# Patient Record
Sex: Female | Born: 1955 | Race: Black or African American | Hispanic: No | Marital: Married | Smoking: Never smoker
Health system: Southern US, Community
[De-identification: ages and names within clinical notes are randomized; demographics above are authoritative.]

## PROBLEM LIST (undated history)

## (undated) DIAGNOSIS — I10 Essential (primary) hypertension: Secondary | ICD-10-CM

## (undated) DIAGNOSIS — E7800 Pure hypercholesterolemia, unspecified: Secondary | ICD-10-CM

## (undated) HISTORY — PX: NECK SURGERY: SHX720

## (undated) HISTORY — PX: COLONOSCOPY: SHX174

---

## 2001-05-06 ENCOUNTER — Encounter: Payer: Self-pay | Admitting: General Surgery

## 2001-05-06 ENCOUNTER — Ambulatory Visit (HOSPITAL_COMMUNITY): Admission: RE | Admit: 2001-05-06 | Discharge: 2001-05-06 | Payer: Self-pay | Admitting: General Surgery

## 2002-03-05 ENCOUNTER — Encounter: Payer: Self-pay | Admitting: General Surgery

## 2002-03-05 ENCOUNTER — Ambulatory Visit (HOSPITAL_COMMUNITY): Admission: RE | Admit: 2002-03-05 | Discharge: 2002-03-05 | Payer: Self-pay | Admitting: General Surgery

## 2003-03-08 ENCOUNTER — Encounter: Payer: Self-pay | Admitting: General Surgery

## 2003-03-08 ENCOUNTER — Ambulatory Visit (HOSPITAL_COMMUNITY): Admission: RE | Admit: 2003-03-08 | Discharge: 2003-03-08 | Payer: Self-pay | Admitting: General Surgery

## 2003-07-23 ENCOUNTER — Encounter: Payer: Self-pay | Admitting: General Surgery

## 2003-07-23 ENCOUNTER — Ambulatory Visit (HOSPITAL_COMMUNITY): Admission: RE | Admit: 2003-07-23 | Discharge: 2003-07-23 | Payer: Self-pay | Admitting: General Surgery

## 2003-09-09 ENCOUNTER — Ambulatory Visit (HOSPITAL_COMMUNITY): Admission: RE | Admit: 2003-09-09 | Discharge: 2003-09-09 | Payer: Self-pay | Admitting: General Surgery

## 2003-09-15 ENCOUNTER — Ambulatory Visit (HOSPITAL_COMMUNITY): Admission: RE | Admit: 2003-09-15 | Discharge: 2003-09-15 | Payer: Self-pay | Admitting: General Surgery

## 2003-09-15 ENCOUNTER — Encounter: Payer: Self-pay | Admitting: General Surgery

## 2004-03-13 ENCOUNTER — Ambulatory Visit (HOSPITAL_COMMUNITY): Admission: RE | Admit: 2004-03-13 | Discharge: 2004-03-13 | Payer: Self-pay | Admitting: Occupational Therapy

## 2005-03-22 ENCOUNTER — Ambulatory Visit (HOSPITAL_COMMUNITY): Admission: RE | Admit: 2005-03-22 | Discharge: 2005-03-22 | Payer: Self-pay | Admitting: General Surgery

## 2006-03-26 ENCOUNTER — Ambulatory Visit (HOSPITAL_COMMUNITY): Admission: RE | Admit: 2006-03-26 | Discharge: 2006-03-26 | Payer: Self-pay | Admitting: General Surgery

## 2006-05-23 ENCOUNTER — Emergency Department (HOSPITAL_COMMUNITY): Admission: EM | Admit: 2006-05-23 | Discharge: 2006-05-23 | Payer: Self-pay | Admitting: Emergency Medicine

## 2006-06-01 ENCOUNTER — Emergency Department (HOSPITAL_COMMUNITY): Admission: EM | Admit: 2006-06-01 | Discharge: 2006-06-01 | Payer: Self-pay | Admitting: Emergency Medicine

## 2006-10-30 ENCOUNTER — Ambulatory Visit: Payer: Self-pay | Admitting: Gastroenterology

## 2006-10-30 ENCOUNTER — Ambulatory Visit (HOSPITAL_COMMUNITY): Admission: RE | Admit: 2006-10-30 | Discharge: 2006-10-30 | Payer: Self-pay | Admitting: Gastroenterology

## 2007-03-28 ENCOUNTER — Ambulatory Visit (HOSPITAL_COMMUNITY): Admission: RE | Admit: 2007-03-28 | Discharge: 2007-03-28 | Payer: Self-pay | Admitting: Family Medicine

## 2008-03-30 ENCOUNTER — Ambulatory Visit (HOSPITAL_COMMUNITY): Admission: RE | Admit: 2008-03-30 | Discharge: 2008-03-30 | Payer: Self-pay | Admitting: Family Medicine

## 2008-04-22 ENCOUNTER — Ambulatory Visit (HOSPITAL_COMMUNITY): Admission: RE | Admit: 2008-04-22 | Discharge: 2008-04-22 | Payer: Self-pay | Admitting: Family Medicine

## 2009-03-07 ENCOUNTER — Encounter (HOSPITAL_COMMUNITY): Admission: RE | Admit: 2009-03-07 | Discharge: 2009-04-06 | Payer: Self-pay | Admitting: Family Medicine

## 2009-04-08 ENCOUNTER — Ambulatory Visit (HOSPITAL_COMMUNITY): Admission: RE | Admit: 2009-04-08 | Discharge: 2009-04-08 | Payer: Self-pay | Admitting: Family Medicine

## 2009-04-22 ENCOUNTER — Ambulatory Visit (HOSPITAL_COMMUNITY): Admission: RE | Admit: 2009-04-22 | Discharge: 2009-04-23 | Payer: Self-pay | Admitting: Neurosurgery

## 2009-05-14 ENCOUNTER — Emergency Department (HOSPITAL_COMMUNITY): Admission: EM | Admit: 2009-05-14 | Discharge: 2009-05-14 | Payer: Self-pay | Admitting: Emergency Medicine

## 2010-04-06 ENCOUNTER — Ambulatory Visit (HOSPITAL_COMMUNITY): Admission: RE | Admit: 2010-04-06 | Discharge: 2010-04-06 | Payer: Self-pay | Admitting: Family Medicine

## 2010-04-15 ENCOUNTER — Emergency Department (HOSPITAL_COMMUNITY): Admission: EM | Admit: 2010-04-15 | Discharge: 2010-04-15 | Payer: Self-pay | Admitting: Family Medicine

## 2010-09-05 ENCOUNTER — Emergency Department (HOSPITAL_COMMUNITY): Admission: EM | Admit: 2010-09-05 | Discharge: 2010-09-05 | Payer: Self-pay | Admitting: Emergency Medicine

## 2010-09-09 ENCOUNTER — Emergency Department (HOSPITAL_COMMUNITY): Admission: EM | Admit: 2010-09-09 | Discharge: 2010-09-09 | Payer: Self-pay | Admitting: Emergency Medicine

## 2010-12-17 ENCOUNTER — Encounter: Payer: Self-pay | Admitting: General Surgery

## 2010-12-18 ENCOUNTER — Encounter: Payer: Self-pay | Admitting: Family Medicine

## 2011-03-06 ENCOUNTER — Other Ambulatory Visit (HOSPITAL_COMMUNITY): Payer: Self-pay | Admitting: Family Medicine

## 2011-03-06 DIAGNOSIS — Z139 Encounter for screening, unspecified: Secondary | ICD-10-CM

## 2011-03-06 LAB — URINALYSIS, ROUTINE W REFLEX MICROSCOPIC
Bilirubin Urine: NEGATIVE
Glucose, UA: NEGATIVE mg/dL
Hgb urine dipstick: NEGATIVE
Protein, ur: NEGATIVE mg/dL
pH: 6.5 (ref 5.0–8.0)

## 2011-03-06 LAB — CBC
MCHC: 34.3 g/dL (ref 30.0–36.0)
MCV: 87.6 fL (ref 78.0–100.0)
Platelets: 223 10*3/uL (ref 150–400)
RBC: 4.06 MIL/uL (ref 3.87–5.11)
WBC: 14.1 10*3/uL — ABNORMAL HIGH (ref 4.0–10.5)

## 2011-03-06 LAB — BASIC METABOLIC PANEL
BUN: 11 mg/dL (ref 6–23)
Calcium: 9.3 mg/dL (ref 8.4–10.5)
Chloride: 105 mEq/L (ref 96–112)
Creatinine, Ser: 0.87 mg/dL (ref 0.4–1.2)
GFR calc Af Amer: >60 mL/min (ref >60)

## 2011-04-10 ENCOUNTER — Ambulatory Visit (HOSPITAL_COMMUNITY)
Admission: RE | Admit: 2011-04-10 | Discharge: 2011-04-10 | Disposition: A | Discharge status: Home / Self Care | Payer: 59 | Source: Ambulatory Visit | Attending: Family Medicine | Admitting: Family Medicine

## 2011-04-10 DIAGNOSIS — Z139 Encounter for screening, unspecified: Secondary | ICD-10-CM

## 2011-04-10 DIAGNOSIS — Z1231 Encounter for screening mammogram for malignant neoplasm of breast: Secondary | ICD-10-CM | POA: Insufficient documentation

## 2011-04-10 NOTE — Op Note (Signed)
NAMECAMESHA, Fitzgerald              ACCOUNT NO.:  [id number]   MEDICAL RECORD NO.:  [id number]          PATIENT TYPE:  OIB   LOCATION:  3534                         FACILITY:  [facility]   PHYSICIAN:  Coletta Memos, M.D.     DATE OF BIRTH:  27-Jan-1956   DATE OF PROCEDURE:  04/22/2009  DATE OF DISCHARGE:                               OPERATIVE REPORT   PREOPERATIVE DIAGNOSIS:  Cervical displaced disk with myelopathy C4-C5.   POSTOPERATIVE DIAGNOSIS:  Cervical displaced disk with myelopathy C4-C5.   PROCEDURES:  1. Anterior cervical decompression, C4-C5.  2. Arthrodesis, C4-C5.  3. Anterior instrumentation, Stryker plate used.   COMPLICATIONS:  None.   SURGEON:  Coletta Memos, MD   ASSISTANT:  Cristi Loron, MD   INDICATIONS:  Karen Fitzgerald is a 55 year old who presented with early  evidence of myelopathy and a very large disk herniation at C4-C5 causing  cord compression along with evidence of altered cord signal on T2  imaging.  I have therefore recommended and she agreed to undergo  operative decompression.   OPERATIVE NOTE:  Karen Fitzgerald was brought to the operating room,  intubated, and placed under a general anesthetic without difficulty.  Her neck was prepped and she was draped in a sterile fashion.  I  infiltrated 2 mL of 0.5% lidocaine with 1:200,000 strength epinephrine  into the cervical region.  I opened the skin with #10 blade and took  this down to the platysma.  I dissected the platysma both in the plane  superior to the platysma and the plane inferior to the platysma after  dividing it sharply with Metzenbaum scissors.  I then with both sharp  and blunt dissection created an avascular corridor to the cervical  spine.  I placed spinal needle to ensure I was at C5-C6.  I moved down  one disk space and proceeded with my procedure.   I reflected the longus colli muscles bilaterally and placed self-  retaining retractor.  I opened the disk space with a #15 blade at  C4-C5.  I used the curette to remove more disk material.  Prior to bringing in  the operative microscope, I was able to remove what were large chunks of  disk.  I used the black hook to try to tease out more disk material,  which had migrated both rostrally and caudally behind the bodies of C5  and C4 respectively.  I then brought the microscope into the operative  field and proceeded to use the drill to create a little wider working  space within the disk space itself.  I used the black hook and again was  able to remove what were large chunks.  With Dr. Lovell Sheehan assistance we  removed the largest piece and could see that the dura was no longer  tented or being depressed.  I inspected both rostrally and caudally and  felt no more large fragments of disk were present.  I irrigated.  I then  prepared for arthrodesis.   I used a high-speed drill to straighten the edges of the vertebral  bodies at C5 and C6.  I placed a 5-mm graft.  I removed the distraction  pins that I placed, one at C4 and the other at C5.  I then placed an  anterior plate.   Instrumentation was placed without difficulty.  Dr. Lovell Sheehan assisted  while I placed 4 screws, 2 at C4 and 2 at C5.  The locking mechanism was  engaged on the Stryker plate.  I then irrigated the wound.  I then  closed the wound in layered fashion.  X-ray showed I was at the correct  location.  The wound was reapproximated with Vicryl sutures,  reapproximating the platysma and subcuticular layers and I used  Dermabond for sterile dressing.           ______________________________  Coletta Memos, M.D.     KC/MEDQ  D:  04/22/2009  T:  04/23/2009  Job:  [id number]

## 2011-04-13 NOTE — H&P (Signed)
   NAME:  Karen Fitzgerald, Karen Fitzgerald                        ACCOUNT NO.:  [id number]   MEDICAL RECORD NO.:  [id number]                   PATIENT TYPE:  AMB   LOCATION:  DAY                                  FACILITY:  [facility]   PHYSICIAN:  Dirk Dress. Katrinka Blazing, M.D.                DATE OF BIRTH:  [birth year]   DATE OF ADMISSION:  DATE OF DISCHARGE:                                HISTORY & PHYSICAL   HISTORY OF PRESENT ILLNESS:  [age]-year-old female with a history of  postprandial nausea without vomiting, upper abdominal pain which sometimes  improves with eating.  She has had mild improvement on Nexium.  The patient  is scheduled for EGD for confirmation.   PAST HISTORY:  She has been relatively healthy.   ALLERGIES:  She has no known drug allergies.   PAST SURGICAL HISTORY:  Her only surgery is a hysterectomy with right  salpingo-oophorectomy.   MEDICATIONS:  Nexium 40 mg daily, which was started on 25 August 2003.   PHYSICAL EXAMINATION:  VITAL SIGNS:  Blood pressure 130/90, pulse of 84,  respirations 20.  Weight 155 pounds.  HEENT:  Unremarkable.  NECK:  Neck supple without JVD or bruit.  CHEST:  Chest clear to auscultation.  No rales, rubs, rhonchi or wheezes.  HEART:  Regular rate and rhythm, without murmur, gallop or rub.  ABDOMEN:  Abdomen soft and nontender.  No masses.  No epigastric or right  upper quadrant tenderness.  Normoactive bowel sounds.  EXTREMITIES:  Extremities normal.  No cyanosis, clubbing or edema.  NEUROLOGIC:  Exam nonfocal.   IMPRESSION:  Recurrent upper abdominal pain with nausea, mildly responsive  to proton pump inhibitors, suspect it is peptic ulcer disease.   PLAN:  EGD.     ___________________________________________                                         Dirk Dress. Katrinka Blazing, M.D.   LCS/MEDQ  D:  09/08/2003  T:  09/09/2003  Job:  [id number]

## 2011-04-13 NOTE — Op Note (Signed)
Karen Fitzgerald, Karen Fitzgerald              ACCOUNT NO.:  [id number]   MEDICAL RECORD NO.:  [id number]          PATIENT TYPE:  AMB   LOCATION:  DAY                           FACILITY:  [facility]   PHYSICIAN:  Kassie Mends, M.D.      DATE OF BIRTH:  1956-07-15   DATE OF PROCEDURE:  10/30/2006  DATE OF DISCHARGE:                               OPERATIVE REPORT   PROCEDURE PERFORMED:  Colonoscopy.   INDICATIONS FOR PROCEDURE:  Ms. Eustice  is a 55 year old female who  presents for average risk colon cancer screening.   FINDINGS:  1. Normal colon without evidence of polyps, masses, inflammatory      changes, diverticula or vascular ectasias.  2. Normal retroflex view of the rectum.   RECOMMENDATIONS:  1. High fiber diet.  2. Screening colonoscopy in 10 years.  3. Follow-up with Annia Friendly. Hill, MD.   MEDICATIONS:  1. Demerol 75 mg IV.  2. Versed 4 mg IV.   DESCRIPTION OF PROCEDURE:  Physical exam was performed and informed  consent was obtained from the patient after explaining the risks,  benefits and alternatives to the procedure.  The patient was connected  to the monitor and placed in the left lateral position.  Continuous  oxygen was provided by nasal cannula and IV medicine administered  through an indwelling cannula.  After administration of sedation and  rectal exam, the patient's rectum was intubated and scope was advanced  under direct visualization to the cecum.  The scope was subsequently  removed slowly by carefully examining the color, texture, anatomy,  integrity of the mucosa on the way out.  The patient was recovered in  the endoscopy suite and discharged to home in satisfactory condition.      Kassie Mends, M.D.  Electronically Signed    SM/MEDQ  D:  10/30/2006  T:  10/30/2006  Job:  [id number]   cc:   Annia Friendly. Loleta Chance, MD  Fax: [phone]

## 2012-04-01 ENCOUNTER — Other Ambulatory Visit (HOSPITAL_COMMUNITY): Payer: Self-pay | Admitting: Family Medicine

## 2012-04-01 DIAGNOSIS — Z139 Encounter for screening, unspecified: Secondary | ICD-10-CM

## 2012-04-14 ENCOUNTER — Ambulatory Visit (HOSPITAL_COMMUNITY): Payer: 59

## 2012-04-17 ENCOUNTER — Ambulatory Visit (HOSPITAL_COMMUNITY): Payer: 59

## 2012-04-17 ENCOUNTER — Ambulatory Visit (HOSPITAL_COMMUNITY)
Admission: RE | Admit: 2012-04-17 | Discharge: 2012-04-17 | Disposition: A | Discharge status: Home / Self Care | Payer: BC Managed Care – PPO | Source: Ambulatory Visit | Attending: Family Medicine | Admitting: Family Medicine

## 2012-04-17 DIAGNOSIS — Z139 Encounter for screening, unspecified: Secondary | ICD-10-CM

## 2012-04-17 DIAGNOSIS — Z1231 Encounter for screening mammogram for malignant neoplasm of breast: Secondary | ICD-10-CM | POA: Insufficient documentation

## 2013-04-15 ENCOUNTER — Other Ambulatory Visit (HOSPITAL_COMMUNITY): Payer: Self-pay | Admitting: Family Medicine

## 2013-04-15 DIAGNOSIS — Z139 Encounter for screening, unspecified: Secondary | ICD-10-CM

## 2013-04-21 ENCOUNTER — Ambulatory Visit (HOSPITAL_COMMUNITY)
Admission: RE | Admit: 2013-04-21 | Discharge: 2013-04-21 | Disposition: A | Discharge status: Home / Self Care | Payer: BC Managed Care – PPO | Source: Ambulatory Visit | Attending: Family Medicine | Admitting: Family Medicine

## 2013-04-21 DIAGNOSIS — Z139 Encounter for screening, unspecified: Secondary | ICD-10-CM

## 2013-04-21 DIAGNOSIS — Z1231 Encounter for screening mammogram for malignant neoplasm of breast: Secondary | ICD-10-CM | POA: Insufficient documentation

## 2013-08-03 ENCOUNTER — Other Ambulatory Visit (HOSPITAL_COMMUNITY)
Admission: RE | Admit: 2013-08-03 | Discharge: 2013-08-03 | Disposition: A | Discharge status: Home / Self Care | Payer: BC Managed Care – PPO | Source: Ambulatory Visit | Attending: Family Medicine | Admitting: Family Medicine

## 2013-08-03 DIAGNOSIS — Z01419 Encounter for gynecological examination (general) (routine) without abnormal findings: Secondary | ICD-10-CM | POA: Insufficient documentation

## 2013-08-03 DIAGNOSIS — Z1151 Encounter for screening for human papillomavirus (HPV): Secondary | ICD-10-CM | POA: Insufficient documentation

## 2014-04-21 ENCOUNTER — Other Ambulatory Visit (HOSPITAL_COMMUNITY): Payer: Self-pay | Admitting: Family Medicine

## 2014-04-21 DIAGNOSIS — Z1231 Encounter for screening mammogram for malignant neoplasm of breast: Secondary | ICD-10-CM

## 2014-04-22 ENCOUNTER — Ambulatory Visit (HOSPITAL_COMMUNITY)
Admission: RE | Admit: 2014-04-22 | Discharge: 2014-04-22 | Disposition: A | Discharge status: Home / Self Care | Payer: BC Managed Care – PPO | Source: Ambulatory Visit | Attending: Family Medicine | Admitting: Family Medicine

## 2014-04-22 DIAGNOSIS — Z1231 Encounter for screening mammogram for malignant neoplasm of breast: Secondary | ICD-10-CM | POA: Insufficient documentation

## 2015-02-16 ENCOUNTER — Ambulatory Visit (HOSPITAL_COMMUNITY)
Admission: RE | Admit: 2015-02-16 | Discharge: 2015-02-16 | Disposition: A | Discharge status: Home / Self Care | Payer: BLUE CROSS/BLUE SHIELD | Source: Ambulatory Visit | Attending: Family Medicine | Admitting: Family Medicine

## 2015-02-16 ENCOUNTER — Other Ambulatory Visit (HOSPITAL_COMMUNITY): Payer: Self-pay | Admitting: Family Medicine

## 2015-02-16 DIAGNOSIS — M25512 Pain in left shoulder: Secondary | ICD-10-CM | POA: Insufficient documentation

## 2015-04-15 ENCOUNTER — Other Ambulatory Visit (HOSPITAL_COMMUNITY): Payer: Self-pay | Admitting: Family Medicine

## 2015-04-15 DIAGNOSIS — Z139 Encounter for screening, unspecified: Secondary | ICD-10-CM

## 2015-04-28 ENCOUNTER — Ambulatory Visit (HOSPITAL_COMMUNITY)
Admission: RE | Admit: 2015-04-28 | Discharge: 2015-04-28 | Disposition: A | Discharge status: Home / Self Care | Payer: BLUE CROSS/BLUE SHIELD | Source: Ambulatory Visit | Attending: Family Medicine | Admitting: Family Medicine

## 2015-04-28 DIAGNOSIS — Z139 Encounter for screening, unspecified: Secondary | ICD-10-CM

## 2015-04-28 DIAGNOSIS — Z1231 Encounter for screening mammogram for malignant neoplasm of breast: Secondary | ICD-10-CM | POA: Insufficient documentation

## 2015-07-25 ENCOUNTER — Other Ambulatory Visit (HOSPITAL_COMMUNITY)
Admission: RE | Admit: 2015-07-25 | Discharge: 2015-07-25 | Disposition: A | Discharge status: Home / Self Care | Payer: BLUE CROSS/BLUE SHIELD | Source: Ambulatory Visit | Attending: Family Medicine | Admitting: Family Medicine

## 2015-07-25 DIAGNOSIS — Z1151 Encounter for screening for human papillomavirus (HPV): Secondary | ICD-10-CM | POA: Insufficient documentation

## 2015-07-25 DIAGNOSIS — Z01419 Encounter for gynecological examination (general) (routine) without abnormal findings: Secondary | ICD-10-CM | POA: Insufficient documentation

## 2016-06-05 ENCOUNTER — Other Ambulatory Visit (HOSPITAL_COMMUNITY): Payer: Self-pay | Admitting: Family Medicine

## 2016-06-05 DIAGNOSIS — Z1231 Encounter for screening mammogram for malignant neoplasm of breast: Secondary | ICD-10-CM

## 2016-06-13 ENCOUNTER — Ambulatory Visit (HOSPITAL_COMMUNITY): Payer: BLUE CROSS/BLUE SHIELD

## 2016-06-14 ENCOUNTER — Ambulatory Visit (HOSPITAL_COMMUNITY)
Admission: RE | Admit: 2016-06-14 | Discharge: 2016-06-14 | Disposition: A | Discharge status: Home / Self Care | Payer: BLUE CROSS/BLUE SHIELD | Source: Ambulatory Visit | Attending: Family Medicine | Admitting: Family Medicine

## 2016-06-14 ENCOUNTER — Ambulatory Visit (HOSPITAL_COMMUNITY): Payer: BLUE CROSS/BLUE SHIELD

## 2016-06-14 DIAGNOSIS — Z1231 Encounter for screening mammogram for malignant neoplasm of breast: Secondary | ICD-10-CM | POA: Insufficient documentation

## 2016-06-18 ENCOUNTER — Ambulatory Visit (HOSPITAL_COMMUNITY): Payer: BLUE CROSS/BLUE SHIELD

## 2016-09-27 ENCOUNTER — Telehealth: Payer: Self-pay

## 2016-09-27 NOTE — Telephone Encounter (Signed)
Letter mailed to call.  

## 2016-09-27 NOTE — Telephone Encounter (Signed)
Recall for tcs °

## 2016-10-24 ENCOUNTER — Telehealth: Payer: Self-pay | Admitting: Gastroenterology

## 2016-10-24 NOTE — Telephone Encounter (Signed)
Pt received letter that it was time to schedule her colonoscopy. She is having no GI problems and she has [employer]. Please call patient to be triaged.   [phone]

## 2016-10-31 ENCOUNTER — Emergency Department (HOSPITAL_COMMUNITY)
Admission: EM | Admit: 2016-10-31 | Discharge: 2016-10-31 | Disposition: A | Discharge status: Home / Self Care | Payer: No Typology Code available for payment source | Attending: Emergency Medicine | Admitting: Emergency Medicine

## 2016-10-31 ENCOUNTER — Encounter (HOSPITAL_COMMUNITY): Payer: Self-pay

## 2016-10-31 ENCOUNTER — Emergency Department (HOSPITAL_COMMUNITY): Payer: No Typology Code available for payment source

## 2016-10-31 DIAGNOSIS — S199XXA Unspecified injury of neck, initial encounter: Secondary | ICD-10-CM | POA: Diagnosis present

## 2016-10-31 DIAGNOSIS — Y999 Unspecified external cause status: Secondary | ICD-10-CM | POA: Insufficient documentation

## 2016-10-31 DIAGNOSIS — M7918 Myalgia, other site: Secondary | ICD-10-CM

## 2016-10-31 DIAGNOSIS — Y9389 Activity, other specified: Secondary | ICD-10-CM | POA: Insufficient documentation

## 2016-10-31 DIAGNOSIS — Z79899 Other long term (current) drug therapy: Secondary | ICD-10-CM | POA: Diagnosis not present

## 2016-10-31 DIAGNOSIS — S161XXA Strain of muscle, fascia and tendon at neck level, initial encounter: Secondary | ICD-10-CM

## 2016-10-31 DIAGNOSIS — Y92410 Unspecified street and highway as the place of occurrence of the external cause: Secondary | ICD-10-CM | POA: Diagnosis not present

## 2016-10-31 DIAGNOSIS — I10 Essential (primary) hypertension: Secondary | ICD-10-CM | POA: Diagnosis not present

## 2016-10-31 HISTORY — DX: Essential (primary) hypertension: I10

## 2016-10-31 HISTORY — DX: Pure hypercholesterolemia, unspecified: E78.00

## 2016-10-31 MED ORDER — IBUPROFEN 800 MG PO TABS
800.0000 mg | ORAL_TABLET | Freq: Once | ORAL | Status: AC
  Administered 2016-10-31: 800 mg via ORAL
  Filled 2016-10-31: qty 1

## 2016-10-31 MED ORDER — CYCLOBENZAPRINE HCL 10 MG PO TABS: 10.0000 mg | ORAL_TABLET | Freq: Two times a day (BID) | ORAL | 0 refills | Status: DC | PRN

## 2016-10-31 MED ORDER — CYCLOBENZAPRINE HCL 10 MG PO TABS
10.0000 mg | ORAL_TABLET | Freq: Once | ORAL | Status: AC
  Administered 2016-10-31: 10 mg via ORAL
  Filled 2016-10-31: qty 1

## 2016-10-31 MED ORDER — IBUPROFEN 600 MG PO TABS: 600.0000 mg | ORAL_TABLET | Freq: Four times a day (QID) | ORAL | 0 refills | Status: DC | PRN
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
## 2016-11-01 ENCOUNTER — Telehealth: Payer: Self-pay

## 2016-11-01 NOTE — Telephone Encounter (Signed)
Triaged today.  

## 2016-11-05 ENCOUNTER — Other Ambulatory Visit: Payer: Self-pay

## 2016-11-05 DIAGNOSIS — Z1211 Encounter for screening for malignant neoplasm of colon: Secondary | ICD-10-CM

## 2016-11-05 NOTE — Telephone Encounter (Signed)
Gastroenterology Pre-Procedure Review  Request Date: 11/01/2016 Requesting Physician: ON RECALL  PATIENT REVIEW QUESTIONS: The patient responded to the following health history questions as indicated:    1. Diabetes Melitis: no 2. Joint replacements in the past 12 months: no 3. Major health problems in the past 3 months: no 4. Has an artificial valve or MVP: no 5. Has a defibrillator: no 6. Has been advised in past to take antibiotics in advance of a procedure like teeth cleaning: no 7. Family history of colon cancer: no  8. Alcohol Use: no 9. History of sleep apnea: no  10. History of coronary artery or other vascular stents placed within the last 12 months: no    MEDICATIONS & ALLERGIES:    Patient reports the following regarding taking any blood thinners:   Plavix? no Aspirin? no Coumadin? no Brilinta? no Xarelto? no Eliquis? no Pradaxa? no Savaysa? no Effient? no  Patient confirms/reports the following medications:  Current Outpatient Prescriptions  Medication Sig Dispense Refill  . amLODipine (NORVASC) 5 MG tablet Take 5 mg by mouth daily.    . benazepril (LOTENSIN) 10 MG tablet Take 10 mg by mouth daily.    . cyclobenzaprine (FLEXERIL) 10 MG tablet Take 1 tablet (10 mg total) by mouth 2 (two) times daily as needed for muscle spasms. 20 tablet 0  . lovastatin (MEVACOR) 20 MG tablet Take 20 mg by mouth at bedtime.    Marland Kitchen ibuprofen (ADVIL,MOTRIN) 600 MG tablet Take 1 tablet (600 mg total) by mouth every 6 (six) hours as needed for moderate pain. (Patient not taking: Reported on 11/01/2016) 30 tablet 0   No current facility-administered medications for this visit.     Patient confirms/reports the following allergies:  No Known Allergies  No orders of the defined types were placed in this encounter.   AUTHORIZATION INFORMATION Primary Insurance:   ID #:  Group #:  Pre-Cert / Auth required: Pre-Cert / Auth #:   Secondary Insurance:   ID #:   Group #:  Pre-Cert / Auth  required: Pre-Cert / Auth #:   SCHEDULE INFORMATION: Procedure has been scheduled as follows:  Date:   11/22/2016             Time:  11:30 AM Location: [facility] Short Stay  This Gastroenterology Pre-Precedure Review Form is being routed to the following provider(s): Barney Drain, MD

## 2016-11-06 MED ORDER — PEG 3350-KCL-NA BICARB-NACL 420 G PO SOLR: 4000.0000 mL | ORAL | 0 refills | Status: DC

## 2016-11-06 NOTE — Telephone Encounter (Signed)
Rx sent to the pharmacy and instructions mailed to pt.  

## 2016-11-06 NOTE — Telephone Encounter (Signed)

## 2016-11-22 ENCOUNTER — Encounter (HOSPITAL_COMMUNITY): Payer: Self-pay | Admitting: *Deleted

## 2016-11-22 ENCOUNTER — Encounter (HOSPITAL_COMMUNITY)
Admission: RE | Disposition: A | Discharge status: Home / Self Care | Payer: Self-pay | Source: Ambulatory Visit | Attending: Gastroenterology

## 2016-11-22 ENCOUNTER — Ambulatory Visit (HOSPITAL_COMMUNITY)
Admission: RE | Admit: 2016-11-22 | Discharge: 2016-11-22 | Disposition: A | Discharge status: Home / Self Care | Payer: BLUE CROSS/BLUE SHIELD | Source: Ambulatory Visit | Attending: Gastroenterology | Admitting: Gastroenterology

## 2016-11-22 DIAGNOSIS — Z1212 Encounter for screening for malignant neoplasm of rectum: Secondary | ICD-10-CM | POA: Diagnosis not present

## 2016-11-22 DIAGNOSIS — Z01818 Encounter for other preprocedural examination: Secondary | ICD-10-CM | POA: Diagnosis not present

## 2016-11-22 DIAGNOSIS — D120 Benign neoplasm of cecum: Secondary | ICD-10-CM | POA: Diagnosis not present

## 2016-11-22 DIAGNOSIS — I10 Essential (primary) hypertension: Secondary | ICD-10-CM | POA: Insufficient documentation

## 2016-11-22 DIAGNOSIS — E7800 Pure hypercholesterolemia, unspecified: Secondary | ICD-10-CM | POA: Insufficient documentation

## 2016-11-22 DIAGNOSIS — Q438 Other specified congenital malformations of intestine: Secondary | ICD-10-CM | POA: Diagnosis not present

## 2016-11-22 DIAGNOSIS — K648 Other hemorrhoids: Secondary | ICD-10-CM | POA: Insufficient documentation

## 2016-11-22 DIAGNOSIS — Z1211 Encounter for screening for malignant neoplasm of colon: Secondary | ICD-10-CM

## 2016-11-22 HISTORY — PX: COLONOSCOPY: SHX5424

## 2016-11-22 SURGERY — COLONOSCOPY
Anesthesia: Moderate Sedation

## 2016-11-22 MED ORDER — MIDAZOLAM HCL 5 MG/5ML IJ SOLN
INTRAMUSCULAR | Status: DC | PRN
  Administered 2016-11-22: 1 mg via INTRAVENOUS
  Administered 2016-11-22: 2 mg via INTRAVENOUS
  Administered 2016-11-22: 1 mg via INTRAVENOUS

## 2016-11-22 MED ORDER — MEPERIDINE HCL 100 MG/ML IJ SOLN
INTRAMUSCULAR | Status: DC | PRN
  Administered 2016-11-22: 25 mg via INTRAVENOUS

## 2016-11-22 MED ORDER — MIDAZOLAM HCL 5 MG/5ML IJ SOLN
INTRAMUSCULAR | Status: AC
  Filled 2016-11-22: qty 5

## 2016-11-22 MED ORDER — SODIUM CHLORIDE 0.9 % IV SOLN
INTRAVENOUS | Status: DC
  Administered 2016-11-22: 11:00:00 via INTRAVENOUS

## 2016-11-22 MED ORDER — MEPERIDINE HCL 100 MG/ML IJ SOLN
INTRAMUSCULAR | Status: AC
  Filled 2016-11-22: qty 1

## 2016-11-22 NOTE — Discharge Instructions (Signed)
You had 1 polyp removed. You have SMALL internal AND LARGE EXTERNAL HEMORRHOIDS.    DRINK WATER TO KEEP YOUR URINE LIGHT YELLOW.  FOLLOW A HIGH FIBER DIET. AVOID ITEMS THAT CAUSE BLOATING & GAS. SEE INFO BELOW.  USE PREPARATION H FOUR TIMES  A DAY IF NEEDED TO RELIEVE RECTAL PAIN/PRESSURE/BLEEDING.  YOUR BIOPSY RESULTS WILL BE AVAILABLE IN MY CHART AFTER JAN 2 AND MY OFFICE WILL CONTACT YOU IN 10-14 DAYS WITH YOUR RESULTS.   Next colonoscopy in 5-10 years.    Colonoscopy Care After Read the instructions outlined below and refer to this sheet in the next week. These discharge instructions provide you with general information on caring for yourself after you leave the hospital. While your treatment has been planned according to the most current medical practices available, unavoidable complications occasionally occur. If you have any problems or questions after discharge, call DR. Rickia Freeburg, [phone].  ACTIVITY  You may resume your regular activity, but move at a slower pace for the next 24 hours.   Take frequent rest periods for the next 24 hours.   Walking will help get rid of the air and reduce the bloated feeling in your belly (abdomen).   No driving for 24 hours (because of the medicine (anesthesia) used during the test).   You may shower.   Do not sign any important legal documents or operate any machinery for 24 hours (because of the anesthesia used during the test).    NUTRITION  Drink plenty of fluids.   You may resume your normal diet as instructed by your doctor.   Begin with a light meal and progress to your normal diet. Heavy or fried foods are harder to digest and may make you feel sick to your stomach (nauseated).   Avoid alcoholic beverages for 24 hours or as instructed.    MEDICATIONS  You may resume your normal medications.   WHAT YOU CAN EXPECT TODAY  Some feelings of bloating in the abdomen.   Passage of more gas than usual.   Spotting of  blood in your stool or on the toilet paper  .  IF YOU HAD POLYPS REMOVED DURING THE COLONOSCOPY:  Eat a soft diet IF YOU HAVE NAUSEA, BLOATING, ABDOMINAL PAIN, OR VOMITING.    FINDING OUT THE RESULTS OF YOUR TEST Not all test results are available during your visit. DR. Oneida Alar WILL CALL YOU WITHIN 14 DAYS OF YOUR PROCEDUE WITH YOUR RESULTS. Do not assume everything is normal if you have not heard from DR. Estephani Popper, CALL HER OFFICE AT [phone].  SEEK IMMEDIATE MEDICAL ATTENTION AND CALL THE OFFICE: [phone] IF:  You have more than a spotting of blood in your stool.   Your belly is swollen (abdominal distention).   You are nauseated or vomiting.   You have a temperature over 101F.   You have abdominal pain or discomfort that is severe or gets worse throughout the day.   High-Fiber Diet A high-fiber diet changes your normal diet to include more whole grains, legumes, fruits, and vegetables. Changes in the diet involve replacing refined carbohydrates with unrefined foods. The calorie level of the diet is essentially unchanged. The Dietary Reference Intake (recommended amount) for adult males is 38 grams per day. For adult females, it is 25 grams per day. Pregnant and lactating women should consume 28 grams of fiber per day. Fiber is the intact part of a plant that is not broken down during digestion. Functional fiber is fiber that has been isolated  from the plant to provide a beneficial effect in the body. PURPOSE  Increase stool bulk.   Ease and regulate bowel movements.   Lower cholesterol.   REDUCE RISK OF COLON CANCER  INDICATIONS THAT YOU NEED MORE FIBER  Constipation and hemorrhoids.   Uncomplicated diverticulosis (intestine condition) and irritable bowel syndrome.   Weight management.   As a protective measure against hardening of the arteries (atherosclerosis), diabetes, and cancer.   GUIDELINES FOR INCREASING FIBER IN THE DIET  Start adding fiber to the  diet slowly. A gradual increase of about 5 more grams (2 slices of whole-wheat bread, 2 servings of most fruits or vegetables, or 1 bowl of high-fiber cereal) per day is best. Too rapid an increase in fiber may result in constipation, flatulence, and bloating.   Drink enough water and fluids to keep your urine clear or pale yellow. Water, juice, or caffeine-free drinks are recommended. Not drinking enough fluid may cause constipation.   Eat a variety of high-fiber foods rather than one type of fiber.   Try to increase your intake of fiber through using high-fiber foods rather than fiber pills or supplements that contain small amounts of fiber.   The goal is to change the types of food eaten. Do not supplement your present diet with high-fiber foods, but replace foods in your present diet.   INCLUDE A VARIETY OF FIBER SOURCES  Replace refined and processed grains with whole grains, canned fruits with fresh fruits, and incorporate other fiber sources. White rice, white breads, and most bakery goods contain little or no fiber.   Brown whole-grain rice, buckwheat oats, and many fruits and vegetables are all good sources of fiber. These include: broccoli, Brussels sprouts, cabbage, cauliflower, beets, sweet potatoes, white potatoes (skin on), carrots, tomatoes, eggplant, squash, berries, fresh fruits, and dried fruits.   Cereals appear to be the richest source of fiber. Cereal fiber is found in whole grains and bran. Bran is the fiber-rich outer coat of cereal grain, which is largely removed in refining. In whole-grain cereals, the bran remains. In breakfast cereals, the largest amount of fiber is found in those with "bran" in their names. The fiber content is sometimes indicated on the label.   You may need to include additional fruits and vegetables each day.   In baking, for 1 cup white flour, you may use the following substitutions:   1 cup whole-wheat flour minus 2 tablespoons.   1/2 cup  white flour plus 1/2 cup whole-wheat flour.   Polyps, Colon  A polyp is extra tissue that grows inside your body. Colon polyps grow in the large intestine. The large intestine, also called the colon, is part of your digestive system. It is a long, hollow tube at the end of your digestive tract where your body makes and stores stool. Most polyps are not dangerous. They are benign. This means they are not cancerous. But over time, some types of polyps can turn into cancer. Polyps that are smaller than a pea are usually not harmful. But larger polyps could someday become or may already be cancerous. To be safe, doctors remove all polyps and test them.   WHO GETS POLYPS? Anyone can get polyps, but certain people are more likely than others. You may have a greater chance of getting polyps if:  You are over 50.   You have had polyps before.   Someone in your family has had polyps.   Someone in your family has had cancer of the  large intestine.   Find out if someone in your family has had polyps. You may also be more likely to get polyps if you:   Eat a lot of fatty foods   Smoke   Drink alcohol   Do not exercise  Eat too much   PREVENTION There is not one sure way to prevent polyps. You might be able to lower your risk of getting them if you:  Eat more fruits and vegetables and less fatty food.   Do not smoke.   Avoid alcohol.   Exercise every day.   Lose weight if you are overweight.   Eating more calcium and folate can also lower your risk of getting polyps. Some foods that are rich in calcium are milk, cheese, and broccoli. Some foods that are rich in folate are chickpeas, kidney beans, and spinach.   Hemorrhoids Hemorrhoids are dilated (enlarged) veins around the rectum. Sometimes clots will form in the veins. This makes them swollen and painful. These are called thrombosed hemorrhoids. Causes of hemorrhoids include:  Constipation.   Straining to have a bowel movement.    HEAVY LIFTING  HOME CARE INSTRUCTIONS  Eat a well balanced diet and drink 6 to 8 glasses of water every day to avoid constipation. You may also use a bulk laxative.   Avoid straining to have bowel movements.   Keep anal area dry and clean.   Do not use a donut shaped pillow or sit on the toilet for long periods. This increases blood pooling and pain.   Move your bowels when your body has the urge; this will require less straining and will decrease pain and pressure.

## 2016-11-22 NOTE — H&P (Signed)
  Primary Care Physician:  Maggie Font, MD Primary Gastroenterologist:  Dr. Oneida Alar  Pre-Procedure History & Physical: HPI:  Karen Fitzgerald is a 60 y.o. female here for [place].  Past Medical History:  Diagnosis Date  . High cholesterol   . Hypertension     Past Surgical History:  Procedure Laterality Date  . COLONOSCOPY    . NECK SURGERY      Prior to Admission medications   Medication Sig Start Date End Date Taking? Authorizing Provider  amLODipine (NORVASC) 5 MG tablet Take 5 mg by mouth daily.   Yes Historical Provider, MD  benazepril (LOTENSIN) 10 MG tablet Take 10 mg by mouth daily.   Yes Historical Provider, MD  cyclobenzaprine (FLEXERIL) 10 MG tablet Take 1 tablet (10 mg total) by mouth 2 (two) times daily as needed for muscle spasms. 10/31/16  Yes Forde Dandy, MD  ibuprofen (ADVIL,MOTRIN) 600 MG tablet Take 600 mg by mouth 3 (three) times daily as needed for spasms. 11/01/16  Yes Historical Provider, MD  lovastatin (MEVACOR) 20 MG tablet Take 20 mg by mouth at bedtime.   Yes Historical Provider, MD  polyethylene glycol-electrolytes (TRILYTE) 420 g solution Take 4,000 mLs by mouth as directed. 11/06/16  Yes Danie Binder, MD    Allergies as of 11/05/2016  . (No Known Allergies)    Family History  Problem Relation Age of Onset  . Colon cancer Neg Hx     Social History   Social History  . Marital status: Married    Spouse name: N/A  . Number of children: N/A  . Years of education: N/A   Occupational History  . Not on file.   Social History Main Topics  . Smoking status: Never Smoker  . Smokeless tobacco: Never Used  . Alcohol use No  . Drug use: No  . Sexual activity: Not on file   Other Topics Concern  . Not on file   Social History Narrative  . No narrative on file    Review of Systems: See HPI, otherwise negative ROS   Physical Exam: BP 129/84   Pulse 87   Temp 97.6 F (36.4 C) (Oral)   Resp 18   Ht 5\' 2"  (1.575 m)    Wt 141 lb (64 kg)   SpO2 100%   BMI 25.79 kg/m  General:   Alert,  pleasant and cooperative in NAD Head:  Normocephalic and atraumatic. Neck:  Supple; Lungs:  Clear throughout to auscultation.    Heart:  Regular rate and rhythm. Abdomen:  Soft, nontender and nondistended. Normal bowel sounds, without guarding, and without rebound.   Neurologic:  Alert and  oriented x4;  grossly normal neurologically.  Impression/Plan:     SCREENING  Plan:  1. TCS TODAY. DISCUSSED PROCEDURE, BENEFITS, & RISKS: < 1% chance of medication reaction, bleeding, perforation, or rupture of spleen/liver.

## 2016-11-22 NOTE — Op Note (Signed)
[facility] Patient Name: Karen Fitzgerald Procedure Date: 11/22/2016 11:39 AM MRN: [id number] Date of Birth: Mar 20, 1956 Attending MD: Barney Drain , MD CSN: [id number] Age: 60 Admit Type: Outpatient Procedure:                Colonoscopy WITH COLD FORCEPS POLYPECOMY Indications:              Screening for colorectal malignant neoplasm Providers:                Barney Drain, MD, Janeece Riggers, RN, Isabella Stalling,                            Technician Referring MD:             Barrie Folk. Hill MD, MD Medicines:                Meperidine 50 mg IV, Midazolam 4 mg IV Complications:            No immediate complications. Estimated Blood Loss:     Estimated blood loss was minimal. Procedure:                Pre-Anesthesia Assessment:                           - Prior to the procedure, a History and Physical                            was performed, and patient medications and                            allergies were reviewed. The patient's tolerance of                            previous anesthesia was also reviewed. The risks                            and benefits of the procedure and the sedation                            options and risks were discussed with the patient.                            All questions were answered, and informed consent                            was obtained. Prior Anticoagulants: The patient has                            taken ibuprofen. ASA Grade Assessment: II - A                            patient with mild systemic disease. After reviewing                            the risks and benefits, the patient was deemed in  satisfactory condition to undergo the procedure.                            After obtaining informed consent, the colonoscope                            was passed under direct vision. Throughout the                            procedure, the patient's blood pressure, pulse, and                            oxygen  saturations were monitored continuously. The                            Colonoscope was introduced through the anus and                            advanced to the the cecum, identified by                            appendiceal orifice and ileocecal valve. The                            ileocecal valve, appendiceal orifice, and rectum                            were photographed. The colonoscopy was somewhat                            difficult due to restricted mobility of the colon.                            Successful completion of the procedure was aided by                            increasing the dose of sedation medication and                            COLOWRAP. The patient tolerated the procedure                            fairly well. The quality of the bowel preparation                            was excellent. Scope In: 12:02:59 PM Scope Out: 12:18:32 PM Scope Withdrawal Time: 0 hours 11 minutes 11 seconds  Total Procedure Duration: 0 hours 15 minutes 33 seconds  Findings:      A 3 mm polyp was found in the cecum. The polyp was sessile. The polyp       was removed with a cold biopsy forceps. Resection and retrieval were       complete.      The recto-sigmoid colon was significantly redundant.      Internal hemorrhoids  were found. Impression:               - One 3 mm polyp in the cecum, removed with a cold                            biopsy forceps. Resected and retrieved.                           - Redundant RECTOSIGMOID colon.                           - Internal hemorrhoids. Moderate Sedation:      Moderate (conscious) sedation was administered by the endoscopy nurse       and supervised by the endoscopist. The following parameters were       monitored: oxygen saturation, heart rate, blood pressure, and response       to care. Total physician intraservice time was 26 minutes. Recommendation:           - High fiber diet.                           - Continue present  medications.                           - Await pathology results.                           - Repeat colonoscopy in 5-10 years for surveillance.                           - Patient has a contact number available for                            emergencies. The signs and symptoms of potential                            delayed complications were discussed with the                            patient. Return to normal activities tomorrow.                            Written discharge instructions were provided to the                            patient. Procedure Code(s):        --- Professional ---                           [phone], Colonoscopy, flexible; with biopsy, single                            or multiple                           99152, Moderate sedation services provided by the  same physician or other qualified health care                            professional performing the diagnostic or                            therapeutic service that the sedation supports,                            requiring the presence of an independent trained                            observer to assist in the monitoring of the                            patient's level of consciousness and physiological                            status; initial 15 minutes of intraservice time,                            patient age 27 years or older                           [phone], Moderate sedation services; each additional                            15 minutes intraservice time Diagnosis Code(s):        --- Professional ---                           Z12.11, Encounter for screening for malignant                            neoplasm of colon                           D12.0, Benign neoplasm of cecum                           K64.8, Other hemorrhoids                           Q43.8, Other specified congenital malformations of                            intestine CPT copyright 2016 American  Medical Association. All rights reserved. The codes documented in this report are preliminary and upon coder review may  be revised to meet current compliance requirements. Barney Drain, MD Barney Drain, MD 11/22/2016 12:32:03 PM This report has been signed electronically. Number of Addenda: 0

## 2016-11-26 ENCOUNTER — Telehealth: Payer: Self-pay | Admitting: Gastroenterology

## 2016-11-26 NOTE — Telephone Encounter (Signed)
Please call pt. She had a simple adenoma removed from her colon.   DRINK WATER TO KEEP YOUR URINE LIGHT YELLOW.  FOLLOW A HIGH FIBER DIET. AVOID ITEMS THAT CAUSE BLOATING & GAS.   USE PREPARATION H FOUR TIMES  A DAY IF NEEDED TO RELIEVE RECTAL PAIN/PRESSURE/BLEEDING.  Next colonoscopy in 5-10 years.

## 2016-11-27 ENCOUNTER — Encounter (HOSPITAL_COMMUNITY): Payer: Self-pay | Admitting: Gastroenterology

## 2016-11-27 NOTE — Telephone Encounter (Signed)
Pt is aware.  

## 2016-11-28 NOTE — Telephone Encounter (Signed)
Reminder in epic °

## 2016-12-11 ENCOUNTER — Ambulatory Visit (HOSPITAL_COMMUNITY): Payer: BLUE CROSS/BLUE SHIELD | Admitting: Physical Therapy

## 2016-12-20 ENCOUNTER — Ambulatory Visit (HOSPITAL_COMMUNITY): Payer: No Typology Code available for payment source | Attending: Family Medicine | Admitting: Physical Therapy

## 2016-12-20 DIAGNOSIS — M62838 Other muscle spasm: Secondary | ICD-10-CM | POA: Diagnosis present

## 2016-12-20 DIAGNOSIS — M542 Cervicalgia: Secondary | ICD-10-CM | POA: Insufficient documentation
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
## 2016-12-20 NOTE — Patient Instructions (Addendum)
Scapular Retraction (Standing)    With arms at sides, pinch shoulder blades together. Repeat __10__ times per set. Do _1___ sets per session. Do __2__ sessions per day.  http://orth.exer.us/944   Copyright  VHI. All rights reserved.  Flexibility: Neck Retraction    Pull head straight back, keeping eyes and jaw level. [id number] ____ times per set. Do __1__ sets per session. Do ___2_ sessions per day.  http://orth.exer.us/344   Copyright  VHI. All rights reserved.  AROM: Lateral Neck Flexion    Slowly tilt head toward one shoulder, then the other. Hold each position __3__ seconds. Repeat _5___ times per set. Do _1__ sets per session. Do __3__ sessions per day.  http://orth.exer.us/296   Copyright  VHI. All rights reserved.  Strengthening: Lateral Bend - Isometric (in Neutral)    Using light pressure from fingertips, press into right temple. Resist bending head sideways. Hold _3_ seconds. Repeat __5-10__ times per set. Do 1____ sets per session. Do ___3_ sessions per day.  http://orth.exer.us/302   Copyright  VHI. All rights reserved.  Strengthening: Extension - Isometric (in Neutral)    Using light pressure from fingertips at back of head, resist bending head backward. Hold __3__ seconds. Repeat __10__ times per set. Do ___1_ sets per session. Do ___2_ sessions per day.  http://orth.exer.us/308   Copyright  VHI. All rights reserved.

## 2016-12-26 ENCOUNTER — Ambulatory Visit (HOSPITAL_COMMUNITY): Payer: No Typology Code available for payment source | Admitting: Physical Therapy

## 2016-12-26 DIAGNOSIS — M542 Cervicalgia: Secondary | ICD-10-CM

## 2016-12-26 DIAGNOSIS — M62838 Other muscle spasm: Secondary | ICD-10-CM

## 2016-12-26 NOTE — Therapy (Signed)
[facility] [place], [place], [address] Phone: [phone]   Fax:  [phone]  Physical Therapy Treatment  Patient Details  Name: Karen Fitzgerald MRN: [id number] Date of Birth: 1956/07/05 Referring Provider: Iona Beard  Encounter Date: 12/26/2016      PT End of Session - 12/26/16 1322    Visit Number 2   Number of Visits 9   Date for PT Re-Evaluation 01/10/17   Authorization Type BCBS   Authorization - Visit Number 2   Authorization - Number of Visits 9   PT Start Time 1125   PT Stop Time 1200   PT Time Calculation (min) 35 min   Activity Tolerance Patient tolerated treatment well   Behavior During Therapy [facility] for tasks assessed/performed      Past Medical History:  Diagnosis Date  . High cholesterol   . Hypertension     Past Surgical History:  Procedure Laterality Date  . COLONOSCOPY    . COLONOSCOPY N/A 11/22/2016   Procedure: COLONOSCOPY;  Surgeon: Danie Binder, MD;  [facility];  Service: Endoscopy;  Laterality: N/A;  11:30 Am  . NECK SURGERY      There were no vitals filed for this visit.      Subjective Assessment - 12/26/16 1135    Subjective Pt states she's been doing her HEP.  States her pain has improved and currently 3/10 in her Lt shoulder/neck region.     Currently in Pain? Yes   Pain Score 3    Pain Location Neck   Pain Orientation Left   Pain Descriptors / Indicators Aching   Pain Type Chronic pain                         [facility] Adult PT Treatment/Exercise - 12/26/16 0001      Neck Exercises: Seated   Cervical Isometrics Extension;Right lateral flexion;Left lateral flexion;5 reps   Neck Retraction 5 reps   X to V 10 reps   W Back Limitations 10 reps   Shoulder Shrugs 10 reps   Shoulder Rolls 10 reps  up, BACK and down...dont come forward   Other Seated Exercise scapular retraction x 10                  PT Short Term Goals - 12/20/16  0947      PT SHORT TERM GOAL #1   Title Pt cervical rotation to have improved by 5 degrees both to the right and to the left for safer driving.    Time 10   Period Days   Status New     PT SHORT TERM GOAL #2   Title Pt pain to be no greater than a 4/10 to allow pt to decrease medication by half prior to going to sleep.    Time 10   Period Days   Status New     PT SHORT TERM GOAL #3   Title Pt mm spasm to be mild to assist in decreasing pain to a 4/10    Time 10   Period Days   Status New           PT Long Term Goals - 12/20/16 0949      PT LONG TERM GOAL #1   Title Pt cervical rotation to be improved in both directions 10 degrees to allow pt to see her blindside with ease   Time 3   Period Weeks  Status New     PT LONG TERM GOAL #2   Title PT pain to be no greater than a 2/10 to allow pt to go to sleep without having to take any medication    Time 3   Period Weeks   Status New     PT LONG TERM GOAL #3   Title Pt cervical strength to be at least a 4+/5 to allow pt to complete her work duties without increased pain    Time 3   Status New               Plan - 12/26/16 1323    Clinical Impression Statement Reviewed HEP and goals per intial evaluation.  Pt was able to complete HEP with cues and corrections from therapist.  Pt with cues to stabilize with movements and isolate correct muscle/groups.  Added new exercises per PT POC with good ROM and form.  Ended session with manual techniques to Lt upper trap.  Very large spasm with trigger point producing symptoms up into her head.  Able to resolve area approx 50%.  No new exercises added to POC this session.    Rehab Potential Good   PT Frequency 3x / week   PT Duration 3 weeks   PT Treatment/Interventions ADLs/Self Care Home Management;Patient/family education;Therapeutic activities;Therapeutic exercise;Manual techniques   PT Next Visit Plan Continue manual to decrease spasm, progress to t-band postural  exercises as well as sidelying and prone strengthening.  Update HEP as needed.   Consulted and Agree with Plan of Care Patient      Patient will benefit from skilled therapeutic intervention in order to improve the following deficits and impairments:  Decreased range of motion, Decreased strength, Pain, Postural dysfunction  Visit Diagnosis: Cervicalgia  Other muscle spasm     Problem List Patient Active Problem List   Diagnosis Date Noted  . Special screening for malignant neoplasms, colon     Teena Irani, PTA/CLT [phone]  12/26/2016, 1:35 PM  [facility] [address] [place], [place], [address] Phone: [phone]   Fax:  [phone]  Name: Karen Fitzgerald MRN: [id number] Date of Birth: [birth year]

## 2016-12-28 ENCOUNTER — Ambulatory Visit (HOSPITAL_COMMUNITY): Payer: No Typology Code available for payment source | Attending: Family Medicine | Admitting: Physical Therapy

## 2016-12-28 DIAGNOSIS — M542 Cervicalgia: Secondary | ICD-10-CM | POA: Diagnosis present

## 2016-12-28 DIAGNOSIS — M62838 Other muscle spasm: Secondary | ICD-10-CM | POA: Insufficient documentation
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
## 2017-01-02 ENCOUNTER — Ambulatory Visit (HOSPITAL_COMMUNITY): Payer: No Typology Code available for payment source | Admitting: Physical Therapy

## 2017-01-02 DIAGNOSIS — M542 Cervicalgia: Secondary | ICD-10-CM

## 2017-01-02 DIAGNOSIS — M62838 Other muscle spasm: Secondary | ICD-10-CM

## 2017-01-02 NOTE — Therapy (Signed)
[place], [place], [address] Phone: [phone]   Fax:  [phone]  Physical Therapy Treatment  Patient Details  Name: Karen Fitzgerald MRN: [id number] Date of Birth: [birth year] Referring Provider: Iona Beard  Encounter Date: 01/02/2017      PT End of Session - 01/02/17 1212    Visit Number 4   Number of Visits 9   Date for PT Re-Evaluation 01/10/17   Authorization Type BCBS   Authorization - Visit Number 4   Authorization - Number of Visits 9   PT Start Time 1119   PT Stop Time 1157   PT Time Calculation (min) 38 min   Activity Tolerance Patient tolerated treatment well   Behavior During Therapy [facility] for tasks assessed/performed      Past Medical History:  Diagnosis Date  . High cholesterol   . Hypertension     Past Surgical History:  Procedure Laterality Date  . COLONOSCOPY    . COLONOSCOPY N/A 11/22/2016   Procedure: COLONOSCOPY;  Surgeon: Danie Binder, MD;  [facility];  Service: Endoscopy;  Laterality: N/A;  11:30 Am  . NECK SURGERY      There were no vitals filed for this visit.      Subjective Assessment - 01/02/17 1154    Subjective Pt reports overall significant improvement.  States its not completely resolved but the pain comes less.   Currently in Pain? No/denies                         [facility] Adult PT Treatment/Exercise - 01/02/17 0001      Neck Exercises: Machines for Strengthening   UBE (Upper Arm Bike) 4' backward only     Neck Exercises: Theraband   Scapula Retraction 10 reps;Green   Shoulder Extension 10 reps;Green   Rows 10 reps;Green   Other Theraband Exercises punch downs 10 reps each green tband     Neck Exercises: Seated   Other Seated Exercise scapular retraction x 10     Neck Exercises: Prone   W Back 10 reps   Shoulder Extension 10 reps   Upper Extremity Flexion with Stabilization 10 reps   Other Prone Exercise rows 10 reps       Manual Therapy   Manual Therapy Soft tissue mobilization   Manual therapy comments in prone; done seperate from all other aspects of treatment   Soft tissue mobilization to decrease spasms in upper and middle trapezius mm                   PT Short Term Goals - 12/28/16 1428      PT SHORT TERM GOAL #1   Title Pt cervical rotation to have improved by 5 degrees both to the right and to the left for safer driving.    Time 10   Period Days   Status On-going     PT SHORT TERM GOAL #2   Title Pt pain to be no greater than a 4/10 to allow pt to decrease medication by half prior to going to sleep.    Time 10   Period Days   Status On-going     PT SHORT TERM GOAL #3   Title Pt mm spasm to be mild to assist in decreasing pain to a 4/10    Time 10   Period Days   Status On-going  PT Long Term Goals - 12/28/16 1428      PT LONG TERM GOAL #1   Title Pt cervical rotation to be improved in both directions 10 degrees to allow pt to see her blindside with ease   Time 3   Period Weeks   Status On-going     PT LONG TERM GOAL #2   Title PT pain to be no greater than a 2/10 to allow pt to go to sleep without having to take any medication    Time 3   Period Weeks   Status On-going     PT LONG TERM GOAL #3   Title Pt cervical strength to be at least a 4+/5 to allow pt to complete her work duties without increased pain    Time 3   Status On-going               Plan - 01/02/17 1212    Clinical Impression Statement Pt is overall improving with less pain and spasm.  Progressed to prone stab exercises this session with tactile cues needed for all exercises for form.  Noted spasm in Lt rhomboid and upper trap region.  Able to resolve this completely this session.  Pt instructed to continue her HEP.  No new exercises added to HEP this session.   Rehab Potential Good   PT Frequency 3x / week   PT Duration 3 weeks   PT Treatment/Interventions ADLs/Self Care  Home Management;Patient/family education;Therapeutic activities;Therapeutic exercise;Manual techniques   PT Next Visit Plan Continue to progress towards goals.  Next session add chest stretch and wall arches.    Consulted and Agree with Plan of Care Patient      Patient will benefit from skilled therapeutic intervention in order to improve the following deficits and impairments:  Decreased range of motion, Decreased strength, Pain, Postural dysfunction  Visit Diagnosis: Cervicalgia  Other muscle spasm     Problem List Patient Active Problem List   Diagnosis Date Noted  . Special screening for malignant neoplasms, colon     Teena Irani, PTA/CLT [phone]  01/02/2017, 12:16 PM  [facility] [address] [place], [place], [address] Phone: [phone]   Fax:  [phone]  Name: Karen Fitzgerald MRN: [id number] Date of Birth: 12-May-1956

## 2017-01-08 ENCOUNTER — Ambulatory Visit (HOSPITAL_COMMUNITY): Payer: No Typology Code available for payment source | Admitting: Physical Therapy

## 2017-01-08 DIAGNOSIS — M62838 Other muscle spasm: Secondary | ICD-10-CM

## 2017-01-08 DIAGNOSIS — M542 Cervicalgia: Secondary | ICD-10-CM

## 2017-01-08 NOTE — Therapy (Signed)
[facility] [place], [place], [address] Phone: [phone]   Fax:  [phone]  Physical Therapy Treatment  Patient Details  Name: Karen Fitzgerald MRN: [id number] Date of Birth: 07/14/1956 Referring Provider: Iona Beard  Encounter Date: 01/08/2017      PT End of Session - 01/08/17 1140    Visit Number 5   Number of Visits 9   Date for PT Re-Evaluation 01/10/17   Authorization Type BCBS   Authorization - Visit Number 5   Authorization - Number of Visits 9   PT Start Time 1120   PT Stop Time 1152   PT Time Calculation (min) 32 min   Activity Tolerance Patient tolerated treatment well   Behavior During Therapy [facility] for tasks assessed/performed      Past Medical History:  Diagnosis Date  . High cholesterol   . Hypertension     Past Surgical History:  Procedure Laterality Date  . COLONOSCOPY    . COLONOSCOPY N/A 11/22/2016   Procedure: COLONOSCOPY;  Surgeon: Danie Binder, MD;  [facility];  Service: Endoscopy;  Laterality: N/A;  11:30 Am  . NECK SURGERY      There were no vitals filed for this visit.      Subjective Assessment - 01/08/17 1126    Subjective Pt states she is overall doing well and has done well until she tried to mop the floor and spasm returned in her Lt shoulder blade region.  Currently 2/10.   Currently in Pain? Yes   Pain Score 2    Pain Location Scapula   Pain Orientation Left   Pain Descriptors / Indicators Spasm   Pain Type Acute pain                         [facility] Adult PT Treatment/Exercise - 01/08/17 0001      Neck Exercises: Machines for Strengthening   UBE (Upper Arm Bike) 4' backward only level 2  level 2     Neck Exercises: Theraband   Scapula Retraction Green;15 reps   Shoulder Extension Green;15 reps   Rows Green;15 reps     Neck Exercises: Standing   Other Standing Exercises wall arches 10 reps                  PT Short  Term Goals - 12/28/16 1428      PT SHORT TERM GOAL #1   Title Pt cervical rotation to have improved by 5 degrees both to the right and to the left for safer driving.    Time 10   Period Days   Status On-going     PT SHORT TERM GOAL #2   Title Pt pain to be no greater than a 4/10 to allow pt to decrease medication by half prior to going to sleep.    Time 10   Period Days   Status On-going     PT SHORT TERM GOAL #3   Title Pt mm spasm to be mild to assist in decreasing pain to a 4/10    Time 10   Period Days   Status On-going           PT Long Term Goals - 12/28/16 1428      PT LONG TERM GOAL #1   Title Pt cervical rotation to be improved in both directions 10 degrees to allow pt to see her blindside with ease   Time  3   Period Weeks   Status On-going     PT LONG TERM GOAL #2   Title PT pain to be no greater than a 2/10 to allow pt to go to sleep without having to take any medication    Time 3   Period Weeks   Status On-going     PT LONG TERM GOAL #3   Title Pt cervical strength to be at least a 4+/5 to allow pt to complete her work duties without increased pain    Time 3   Status On-going               Plan - 01/08/17 1141    Clinical Impression Statement Pt with noted improvement in scapular and upper back strength.  Able to complete prone exercises against gravity and theraband exercises with increased ease and increased reps.  Added corner stretch and wall arches this session.  Also able to increase difficulty to level 2 on UBE today as well without difficulty. Spasm only in Lt upper trap region this session.  Very large spasm in Lt rhomboid and tightness in Lt thoracic paraspinals.  Able to decrease approxm 80% following manual .   Rehab Potential Good   PT Frequency 3x / week   PT Duration 3 weeks   PT Treatment/Interventions ADLs/Self Care Home Management;Patient/family education;Therapeutic activities;Therapeutic exercise;Manual techniques   PT Next  Visit Plan Continue to progress towards goals. Next session work on Pharmacist, hospital with job duties, i.e. sweeping/mopping, Social research officer, government.   Consulted and Agree with Plan of Care Patient      Patient will benefit from skilled therapeutic intervention in order to improve the following deficits and impairments:  Decreased range of motion, Decreased strength, Pain, Postural dysfunction  Visit Diagnosis: Cervicalgia  Other muscle spasm     Problem List Patient Active Problem List   Diagnosis Date Noted  . Special screening for malignant neoplasms, colon     Teena Irani, PTA/CLT [phone]  01/08/2017, 11:54 AM  [facility] [address] [place], [place], [address] Phone: [phone]   Fax:  [phone]  Name: Karen Fitzgerald MRN: [id number] Date of Birth: 03-02-1956

## 2017-01-14 ENCOUNTER — Telehealth (HOSPITAL_COMMUNITY): Payer: Self-pay | Admitting: Family Medicine

## 2017-01-14 ENCOUNTER — Ambulatory Visit (HOSPITAL_COMMUNITY): Payer: No Typology Code available for payment source | Admitting: Physical Therapy

## 2017-01-14 NOTE — Telephone Encounter (Signed)
01/14/17 pt called to say that she was sick today and had to cx appt.

## 2017-01-16 ENCOUNTER — Ambulatory Visit (HOSPITAL_COMMUNITY): Payer: No Typology Code available for payment source | Admitting: Physical Therapy

## 2017-01-16 DIAGNOSIS — M542 Cervicalgia: Secondary | ICD-10-CM | POA: Diagnosis not present

## 2017-01-16 DIAGNOSIS — M62838 Other muscle spasm: Secondary | ICD-10-CM

## 2017-01-16 NOTE — Therapy (Signed)
[place] [address], [place], [address] Phone: [phone]   Fax:  [phone]  Physical Therapy Treatment  Patient Details  Name: Karen Fitzgerald MRN: [id number] Date of Birth: [birth year] Referring Provider: Iona Beard  Encounter Date: 01/16/2017      PT End of Session - 01/16/17 1202    Visit Number 6   Number of Visits 9   Date for PT Re-Evaluation 01/10/17   Authorization Type BCBS   Authorization - Visit Number 6   Authorization - Number of Visits 9   PT Start Time 1115   PT Stop Time 1155   PT Time Calculation (min) 40 min   Activity Tolerance Patient tolerated treatment well   Behavior During Therapy [facility] for tasks assessed/performed      Past Medical History:  Diagnosis Date  . High cholesterol   . Hypertension     Past Surgical History:  Procedure Laterality Date  . COLONOSCOPY    . COLONOSCOPY N/A 11/22/2016   Procedure: COLONOSCOPY;  Surgeon: Danie Binder, MD;  [facility];  Service: Endoscopy;  Laterality: N/A;  11:30 Am  . NECK SURGERY      There were no vitals filed for this visit.      Subjective Assessment - 01/16/17 1207    Subjective Pt states that she is doing everything now she just comes and goes as far as pulling in her Lt side.     Currently in Pain? Yes   Pain Score 1    Pain Location Neck   Pain Orientation Left   Pain Descriptors / Indicators Tightness   Pain Type Chronic pain            [facility] PT Assessment - 01/16/17 0001      Assessment   Medical Diagnosis Cervical pain   Onset Date/Surgical Date 10/31/16   Next MD Visit unknown    Prior Therapy none for this episode      Precautions   Precautions None     Restrictions   Weight Bearing Restrictions No     Prior Function   Level of Independence Independent     Cognition   Overall Cognitive Status Within Functional Limits for tasks assessed     Posture/Postural Control   Posture/Postural  Control Postural limitations   Postural Limitations Rounded Shoulders;Forward head     AROM   Cervical Extension wfl reps no change   Cervical - Right Side Bend 32   Cervical - Left Side Bend 30 degrees reps increase pain    Cervical - Right Rotation 66   Cervical - Left Rotation 60     Strength   Cervical Extension 4/5   Cervical - Right Side Bend 4/5   Cervical - Left Side Bend 4/5     Palpation   Palpation comment moderate spasm bilaterally                       [facility] Adult PT Treatment/Exercise - 01/16/17 0001      Neck Exercises: Machines for Strengthening   UBE (Upper Arm Bike) 4' backward only level 2  level 2     Neck Exercises: Theraband   Scapula Retraction Green;15 reps  given as a HEP   Shoulder Extension Green;15 reps   Rows Green;15 reps     Neck Exercises: Standing   Other Standing Exercises wall arches 10 reps     Neck Exercises: Seated  Cervical Isometrics Flexion;Right lateral flexion;Left lateral flexion;10 reps   Cervical Rotation Both;5 reps   Lateral Flexion Both;5 reps   Other Seated Exercise scapular retraction x 10     Neck Exercises: Prone   W Back 10 reps   Shoulder Extension 10 reps   Shoulder Extension Weights (lbs) 2#   Upper Extremity Flexion with Stabilization 10 reps   Other Prone Exercise rows 10 reps ; 2#   Other Prone Exercise Y's and T'x x 10 each                 PT Education - 01/16/17 1133    Education provided Yes   Education Details T-band postural exercises    Person(s) Educated Patient   Methods Explanation;Handout   Comprehension Verbalized understanding          PT Short Term Goals - 01/16/17 1204      PT SHORT TERM GOAL #1   Title Pt cervical rotation to have improved by 5 degrees both to the right and to the left for safer driving.    Time 10   Period Days   Status Achieved     PT SHORT TERM GOAL #2   Title Pt pain to be no greater than a 4/10 to allow pt to decrease medication  by half prior to going to sleep.    Time 10   Period Days   Status Achieved     PT SHORT TERM GOAL #3   Title Pt mm spasm to be mild to assist in decreasing pain to a 4/10    Time 10   Period Days   Status Achieved           PT Long Term Goals - 01/16/17 1204      PT LONG TERM GOAL #1   Title Pt cervical rotation to be improved in both directions 10 degrees to allow pt to see her blindside with ease   Time 3   Period Weeks   Status Achieved     PT LONG TERM GOAL #2   Title PT pain to be no greater than a 2/10 to allow pt to go to sleep without having to take any medication    Time 3   Period Weeks   Status Achieved     PT LONG TERM GOAL #3   Title Pt cervical strength to be at least a 4+/5 to allow pt to complete her work duties without increased pain    Time 3   Status On-going               Plan - 01/16/17 1202    Clinical Impression Statement ROM and strength retested with improved rotation and global improvement in strength.  Pt given T-band for HEP.  Added wt to prone exercises as well as began wall push up activities.     Rehab Potential Good   PT Frequency 3x / week   PT Duration 3 weeks   PT Treatment/Interventions ADLs/Self Care Home Management;Patient/family education;Therapeutic activities;Therapeutic exercise;Manual techniques   PT Next Visit Plan  Next session work on Pharmacist, hospital with job duties, i.e. sweeping/mopping, etc.   Consulted and Agree with Plan of Care Patient      Patient will benefit from skilled therapeutic intervention in order to improve the following deficits and impairments:  Decreased range of motion, Decreased strength, Pain, Postural dysfunction  Visit Diagnosis: Cervicalgia  Other muscle spasm     Problem List Patient Active Problem List  Diagnosis Date Noted  . Special screening for malignant neoplasms, colon     Rayetta Humphrey, PT CLT [phone] 01/16/2017, 12:09 PM  [name] [address] [place], [place], [address] Phone: [phone]   Fax:  [phone]  Name: Karen Fitzgerald MRN: [id number] Date of Birth: 1955-12-16

## 2017-01-22 ENCOUNTER — Ambulatory Visit (HOSPITAL_COMMUNITY): Payer: No Typology Code available for payment source | Admitting: Physical Therapy

## 2017-01-22 DIAGNOSIS — M542 Cervicalgia: Secondary | ICD-10-CM | POA: Diagnosis not present

## 2017-01-22 DIAGNOSIS — M62838 Other muscle spasm: Secondary | ICD-10-CM

## 2017-01-22 NOTE — Therapy (Signed)
[place], [place], [address] Phone: [phone]   Fax:  [phone]  Physical Therapy Treatment  Patient Details  Name: Karen Fitzgerald MRN: [id number] Date of Birth: 02-Feb-1956 Referring Provider: Iona Beard  Encounter Date: 01/22/2017      PT End of Session - 01/22/17 2105    Visit Number 7   Number of Visits 9   Date for PT Re-Evaluation 01/10/17   Authorization Type BCBS   Authorization - Visit Number 7   Authorization - Number of Visits 9   PT Start Time 1522   PT Stop Time 1600   PT Time Calculation (min) 38 min   Activity Tolerance Patient tolerated treatment well   Behavior During Therapy [facility] for tasks assessed/performed      Past Medical History:  Diagnosis Date  . High cholesterol   . Hypertension     Past Surgical History:  Procedure Laterality Date  . COLONOSCOPY    . COLONOSCOPY N/A 11/22/2016   Procedure: COLONOSCOPY;  Surgeon: Danie Binder, MD;  [facility];  Service: Endoscopy;  Laterality: N/A;  11:30 Am  . NECK SURGERY      There were no vitals filed for this visit.                       [place] Adult PT Treatment/Exercise - 01/22/17 0001      Neck Exercises: Machines for Strengthening   UBE (Upper Arm Bike) 4' backward only level 2     Neck Exercises: Theraband   Scapula Retraction Green;15 reps   Shoulder Extension Green;15 reps   Rows Green;15 reps     Neck Exercises: Standing   Other Standing Exercises proper lifting various levels, functional work Animal nutritionist     Neck Exercises: Prone   W Back 15 reps   Shoulder Extension 10 reps   Shoulder Extension Weights (lbs) 2#   Other Prone Exercise rows 10 reps ; 2#   Other Prone Exercise Y's and T'x x 15 each                 PT Education - 01/22/17 2113    Education provided Yes   Education Details work duties and using Scientific laboratory technician) Educated  Patient   Methods Explanation;Demonstration   Comprehension Verbalized understanding;Returned demonstration;Verbal cues required;Tactile cues required;Need further instruction          PT Short Term Goals - 01/16/17 1204      PT SHORT TERM GOAL #1   Title Pt cervical rotation to have improved by 5 degrees both to the right and to the left for safer driving.    Time 10   Period Days   Status Achieved     PT SHORT TERM GOAL #2   Title Pt pain to be no greater than a 4/10 to allow pt to decrease medication by half prior to going to sleep.    Time 10   Period Days   Status Achieved     PT SHORT TERM GOAL #3   Title Pt mm spasm to be mild to assist in decreasing pain to a 4/10    Time 10   Period Days   Status Achieved           PT Long Term Goals - 01/16/17 1204      PT LONG TERM GOAL #1   Title Pt cervical rotation to  be improved in both directions 10 degrees to allow pt to see her blindside with ease   Time 3   Period Weeks   Status Achieved     PT LONG TERM GOAL #2   Title PT pain to be no greater than a 2/10 to allow pt to go to sleep without having to take any medication    Time 3   Period Weeks   Status Achieved     PT LONG TERM GOAL #3   Title Pt cervical strength to be at least a 4+/5 to allow pt to complete her work duties without increased pain    Time 3   Status On-going               Plan - 01/22/17 2107    Clinical Impression Statement contiued with postural strengthening exercises.  Overall improvement in form and no difficulties with added weight.  Reports compliance with theraband at home, however continues to need cues for proper form and muscle activation completing these.  Began basic body mechanics as related to work duties.  Discovered patient has been moping in a sweeping lateral form while standing static placing rotational forces on her spine.  Instructed with correct form form completing this in a lunge form with patient able to  demonstrate correctly.  Pt also instructed with lifting from various heights and golfers lift for picking trash up off the floor.  Pt with good form completing these.  Pt will need reinforcment of these activities, however anticpate    Rehab Potential Good   PT Frequency 3x / week   PT Duration 3 weeks   PT Treatment/Interventions ADLs/Self Care Home Management;Patient/family education;Therapeutic activities;Therapeutic exercise;Manual techniques   PT Next Visit Plan Continue to work on postural strengthening and body mechanics.  Introduce planks and discuss other work related issues.  Check musculature to assess tightness in upper traps.   Consulted and Agree with Plan of Care Patient      Patient will benefit from skilled therapeutic intervention in order to improve the following deficits and impairments:  Decreased range of motion, Decreased strength, Pain, Postural dysfunction  Visit Diagnosis: Cervicalgia  Other muscle spasm     Problem List Patient Active Problem List   Diagnosis Date Noted  . Special screening for malignant neoplasms, colon     Karen Fitzgerald, PTA/CLT [phone]  01/22/2017, 9:14 PM  [place] [address] [place], [place], [address] Phone: [phone]   Fax:  [phone]  Name: Karen Fitzgerald MRN: [id number] Date of Birth: 1956-08-29

## 2017-01-24 ENCOUNTER — Ambulatory Visit (HOSPITAL_COMMUNITY): Payer: No Typology Code available for payment source | Attending: Family Medicine | Admitting: Physical Therapy

## 2017-01-24 DIAGNOSIS — M542 Cervicalgia: Secondary | ICD-10-CM | POA: Diagnosis not present

## 2017-01-24 DIAGNOSIS — M62838 Other muscle spasm: Secondary | ICD-10-CM | POA: Insufficient documentation

## 2017-01-24 NOTE — Therapy (Signed)
[place], [place], [address] Phone: [phone]   Fax:  [phone]  Physical Therapy Treatment  Patient Details  Name: Karen Fitzgerald MRN: [id number] Date of Birth: 02-03-1956 Referring Provider: Iona Beard  Encounter Date: 01/24/2017      PT End of Session - 01/24/17 1016    Visit Number 8   Number of Visits 8   Date for PT Re-Evaluation 01/10/17   Authorization Type BCBS   Authorization - Visit Number 8   Authorization - Number of Visits 8   PT Start Time [phone]   PT Stop Time 1015   PT Time Calculation (min) 27 min   Activity Tolerance Patient tolerated treatment well   Behavior During Therapy [facility] for tasks assessed/performed      Past Medical History:  Diagnosis Date  . High cholesterol   . Hypertension     Past Surgical History:  Procedure Laterality Date  . COLONOSCOPY    . COLONOSCOPY N/A 11/22/2016   Procedure: COLONOSCOPY;  Surgeon: Danie Binder, MD;  [facility];  Service: Endoscopy;  Laterality: N/A;  11:30 Am  . NECK SURGERY      There were no vitals filed for this visit.      Subjective Assessment - 01/24/17 1029    Subjective Pt states she has not had pain in weeks.  Sometimes has some tightness or pulling but no pain.     Currently in Pain? No/denies            [facility] PT Assessment - 01/24/17 0001      Assessment   Medical Diagnosis Cervical pain   Onset Date/Surgical Date 10/31/16   Next MD Visit 3/5     Observation/Other Assessments   Focus on Therapeutic Outcomes (FOTO)  66  was 45     AROM   Cervical Extension WFL  was [facility]   Cervical - Right Side Bend 40  was 32   Cervical - Left Side Bend 35  was 30   Cervical - Right Rotation 66  was 55   Cervical - Left Rotation 60  was 48     Strength   Cervical Extension 4/5  was 3-/5   Cervical - Right Side Bend 4+/5  was 4-/5   Cervical - Left Side Bend 4+/5  was 4-/5                      [facility] Adult PT Treatment/Exercise - 01/24/17 0001      Neck Exercises: Machines for Strengthening   UBE (Upper Arm Bike) 4' backward only level 2     Neck Exercises: Theraband   Scapula Retraction Green;15 reps   Shoulder Extension Green;15 reps   Rows Green;15 reps     Neck Exercises: Standing   Other Standing Exercises proper lifting various levels, functional work Animal nutritionist                  PT Short Term Goals - 01/24/17 1018      PT SHORT TERM GOAL #1   Title Pt cervical rotation to have improved by 5 degrees both to the right and to the left for safer driving.    Time 10   Period Days   Status Achieved     PT SHORT TERM GOAL #2   Title Pt pain to be no greater than a 4/10 to allow pt to decrease medication by half prior  to going to sleep.    Time 10   Period Days   Status Achieved     PT SHORT TERM GOAL #3   Title Pt mm spasm to be mild to assist in decreasing pain to a 4/10    Time 10   Period Days   Status Achieved           PT Long Term Goals - 01/24/17 1018      PT LONG TERM GOAL #1   Title Pt cervical rotation to be improved in both directions 10 degrees to allow pt to see her blindside with ease   Time 3   Period Weeks   Status Achieved     PT LONG TERM GOAL #2   Title PT pain to be no greater than a 2/10 to allow pt to go to sleep without having to take any medication    Time 3   Period Weeks   Status Achieved     PT LONG TERM GOAL #3   Title Pt cervical strength to be at least a 4+/5 to allow pt to complete her work duties without increased pain    Time 3   Status Partially Met  extension is 4-/5, all others 4+/5               Plan - 01/24/17 1024    Clinical Impression Statement Pt is very pleased with progress.  Reviewed theraband postural exercises and patient able to complete independently without cues.  Reviewed body mechanics and able to demonstrate correcctly.  States she  vacuumed yesterday and could tell a difference by using better form.  Pt has met all goals except cervical extension strength, which is still 1/2 grade weaker than other cervical muscles.  Pt confident in readiness to be discharged at this time.  No longer with pain or symtoms.     Rehab Potential Good   PT Frequency 3x / week   PT Duration 3 weeks   PT Treatment/Interventions ADLs/Self Care Home Management;Patient/family education;Therapeutic activities;Therapeutic exercise;Manual techniques   PT Next Visit Plan discharge as goals met and patient requests at this time.     Consulted and Agree with Plan of Care Patient      Patient will benefit from skilled therapeutic intervention in order to improve the following deficits and impairments:  Decreased range of motion, Decreased strength, Pain, Postural dysfunction  Visit Diagnosis: Cervicalgia  Other muscle spasm     Problem List Patient Active Problem List   Diagnosis Date Noted  . Special screening for malignant neoplasms, colon     Teena Irani, PTA/CLT [place], PT CLT [phone] 01/24/2017, 10:29 AM  [place], [place], [address] Phone: [phone]   Fax:  [phone]  Name: Karen Fitzgerald MRN: [id number] Date of Birth: 02/22/1956  PHYSICAL THERAPY DISCHARGE SUMMARY  Visits from Start of Care: 8  Current functional level related to goals / functional outcomes: See above   Remaining deficits: See above   Education / Equipment: Body mechanics  Plan: Patient agrees to discharge.  Patient goals were met. Patient is being discharged due to being pleased with the current functional level.  ?????        Rayetta Humphrey, [place] CLT [phone]

## 2017-01-29 ENCOUNTER — Ambulatory Visit (HOSPITAL_COMMUNITY): Payer: No Typology Code available for payment source | Admitting: Physical Therapy

## 2017-01-31 ENCOUNTER — Ambulatory Visit (HOSPITAL_COMMUNITY): Payer: No Typology Code available for payment source | Admitting: Physical Therapy

## 2017-02-06 ENCOUNTER — Telehealth (HOSPITAL_COMMUNITY): Payer: Self-pay | Admitting: Physical Therapy

## 2017-02-06 NOTE — Telephone Encounter (Signed)
Pt will come by today and pick up a copy of progress note from 12/20/16. Paper work at [employer] and release form is attached. N F 02/06/17

## 2017-05-30 ENCOUNTER — Telehealth (HOSPITAL_COMMUNITY): Payer: Self-pay | Admitting: Physical Therapy

## 2017-05-30 NOTE — Telephone Encounter (Signed)
Darnelle Maffucci called form [employer]  to get our billing address and Tax ID number. I gave him the Patient Accounting number [phone]

## 2017-06-27 ENCOUNTER — Other Ambulatory Visit (HOSPITAL_COMMUNITY): Payer: Self-pay | Admitting: Family Medicine

## 2017-06-27 DIAGNOSIS — Z1231 Encounter for screening mammogram for malignant neoplasm of breast: Secondary | ICD-10-CM

## 2017-07-01 ENCOUNTER — Ambulatory Visit (HOSPITAL_COMMUNITY): Payer: BLUE CROSS/BLUE SHIELD

## 2017-07-03 ENCOUNTER — Ambulatory Visit (HOSPITAL_COMMUNITY)
Admission: RE | Admit: 2017-07-03 | Discharge: 2017-07-03 | Disposition: A | Discharge status: Home / Self Care | Payer: Commercial Managed Care - PPO | Source: Ambulatory Visit | Attending: Family Medicine | Admitting: Family Medicine

## 2017-07-03 DIAGNOSIS — Z1231 Encounter for screening mammogram for malignant neoplasm of breast: Secondary | ICD-10-CM

## 2017-12-31 ENCOUNTER — Other Ambulatory Visit (HOSPITAL_COMMUNITY): Payer: Self-pay | Admitting: Family Medicine

## 2017-12-31 DIAGNOSIS — R10811 Right upper quadrant abdominal tenderness: Secondary | ICD-10-CM

## 2018-01-03 ENCOUNTER — Ambulatory Visit (HOSPITAL_COMMUNITY)
Admission: RE | Admit: 2018-01-03 | Discharge: 2018-01-03 | Disposition: A | Discharge status: Home / Self Care | Payer: Commercial Managed Care - PPO | Source: Ambulatory Visit | Attending: Family Medicine | Admitting: Family Medicine

## 2018-01-03 DIAGNOSIS — R10811 Right upper quadrant abdominal tenderness: Secondary | ICD-10-CM | POA: Diagnosis not present

## 2018-01-03 DIAGNOSIS — K824 Cholesterolosis of gallbladder: Secondary | ICD-10-CM | POA: Insufficient documentation

## 2018-01-03 DIAGNOSIS — R110 Nausea: Secondary | ICD-10-CM | POA: Insufficient documentation

## 2018-04-09 ENCOUNTER — Other Ambulatory Visit (HOSPITAL_COMMUNITY): Payer: Self-pay | Admitting: Family Medicine

## 2018-04-09 DIAGNOSIS — Z1231 Encounter for screening mammogram for malignant neoplasm of breast: Secondary | ICD-10-CM
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
## 2018-07-10 ENCOUNTER — Ambulatory Visit (HOSPITAL_COMMUNITY): Payer: Commercial Managed Care - PPO

## 2018-07-24 ENCOUNTER — Ambulatory Visit (HOSPITAL_COMMUNITY)
Admission: RE | Admit: 2018-07-24 | Discharge: 2018-07-24 | Disposition: A | Discharge status: Home / Self Care | Payer: Commercial Managed Care - PPO | Source: Ambulatory Visit | Attending: Family Medicine | Admitting: Family Medicine

## 2018-07-24 DIAGNOSIS — Z1231 Encounter for screening mammogram for malignant neoplasm of breast: Secondary | ICD-10-CM | POA: Insufficient documentation

## 2019-06-22 ENCOUNTER — Other Ambulatory Visit (HOSPITAL_COMMUNITY): Payer: Self-pay | Admitting: Family Medicine

## 2019-06-22 DIAGNOSIS — Z1231 Encounter for screening mammogram for malignant neoplasm of breast: Secondary | ICD-10-CM

## 2019-07-27 ENCOUNTER — Other Ambulatory Visit: Payer: Self-pay

## 2019-07-27 ENCOUNTER — Ambulatory Visit (HOSPITAL_COMMUNITY)
Admission: RE | Admit: 2019-07-27 | Discharge: 2019-07-27 | Disposition: A | Discharge status: Home / Self Care | Payer: Commercial Managed Care - PPO | Source: Ambulatory Visit | Attending: Family Medicine | Admitting: Family Medicine

## 2019-07-27 DIAGNOSIS — Z1231 Encounter for screening mammogram for malignant neoplasm of breast: Secondary | ICD-10-CM | POA: Insufficient documentation

## 2020-04-05 ENCOUNTER — Emergency Department (HOSPITAL_COMMUNITY): Payer: Commercial Managed Care - PPO

## 2020-04-05 ENCOUNTER — Other Ambulatory Visit: Payer: Self-pay

## 2020-04-05 ENCOUNTER — Encounter (HOSPITAL_COMMUNITY): Payer: Self-pay

## 2020-04-05 ENCOUNTER — Emergency Department (HOSPITAL_COMMUNITY)
Admission: EM | Admit: 2020-04-05 | Discharge: 2020-04-05 | Disposition: A | Discharge status: Home / Self Care | Payer: Commercial Managed Care - PPO | Attending: Emergency Medicine | Admitting: Emergency Medicine

## 2020-04-05 DIAGNOSIS — I10 Essential (primary) hypertension: Secondary | ICD-10-CM | POA: Diagnosis not present

## 2020-04-05 DIAGNOSIS — Y939 Activity, unspecified: Secondary | ICD-10-CM | POA: Diagnosis not present

## 2020-04-05 DIAGNOSIS — Z79899 Other long term (current) drug therapy: Secondary | ICD-10-CM | POA: Insufficient documentation

## 2020-04-05 DIAGNOSIS — M25512 Pain in left shoulder: Secondary | ICD-10-CM | POA: Insufficient documentation

## 2020-04-05 DIAGNOSIS — Y92410 Unspecified street and highway as the place of occurrence of the external cause: Secondary | ICD-10-CM | POA: Insufficient documentation

## 2020-04-05 DIAGNOSIS — Y999 Unspecified external cause status: Secondary | ICD-10-CM | POA: Diagnosis not present

## 2020-04-05 MED ORDER — METHOCARBAMOL 500 MG PO TABS: 500.0000 mg | ORAL_TABLET | Freq: Two times a day (BID) | ORAL | 0 refills | Status: DC
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
## 2020-04-05 NOTE — Discharge Instructions (Signed)
Take Tylenol or Motrin as needed for pain.  Take Robaxin as needed for muscle spasms.  Do not drive or operate heavy machinery on Robaxin.  Please follow-up with your primary care provider in 2 days for continued evaluation.  Return to the emergency room immediately for new or worsening symptoms or concerns, such as chest pain, shortness of breath, abdominal pain, vomiting or any concerns at all.
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
## 2020-06-21 ENCOUNTER — Other Ambulatory Visit (HOSPITAL_COMMUNITY): Payer: Self-pay | Admitting: Family Medicine

## 2020-06-21 DIAGNOSIS — Z1231 Encounter for screening mammogram for malignant neoplasm of breast: Secondary | ICD-10-CM

## 2020-07-29 ENCOUNTER — Ambulatory Visit (HOSPITAL_COMMUNITY)
Admission: RE | Admit: 2020-07-29 | Discharge: 2020-07-29 | Disposition: A | Discharge status: Home / Self Care | Payer: Commercial Managed Care - PPO | Source: Ambulatory Visit | Attending: Family Medicine | Admitting: Family Medicine

## 2020-07-29 ENCOUNTER — Other Ambulatory Visit: Payer: Self-pay

## 2020-07-29 DIAGNOSIS — Z1231 Encounter for screening mammogram for malignant neoplasm of breast: Secondary | ICD-10-CM | POA: Insufficient documentation

## 2020-11-16 ENCOUNTER — Other Ambulatory Visit: Payer: Self-pay | Admitting: Family Medicine

## 2020-11-16 DIAGNOSIS — L723 Sebaceous cyst: Secondary | ICD-10-CM

## 2020-12-01 ENCOUNTER — Ambulatory Visit (INDEPENDENT_AMBULATORY_CARE_PROVIDER_SITE_OTHER): Payer: Medicare HMO | Admitting: General Surgery

## 2020-12-01 ENCOUNTER — Encounter: Payer: Self-pay | Admitting: General Surgery

## 2020-12-01 ENCOUNTER — Other Ambulatory Visit: Payer: Self-pay

## 2020-12-01 VITALS — BP 117/69 | HR 87 | Temp 98.2°F | Resp 12 | Ht 63.0 in | Wt 154.0 lb

## 2020-12-01 DIAGNOSIS — L723 Sebaceous cyst: Secondary | ICD-10-CM | POA: Insufficient documentation

## 2020-12-01 NOTE — Progress Notes (Signed)
[facility] History and Physical  Reason for Referral: Back sebaceous cyst  Referring Physician: Dr. Loleta Chance  Chief Complaint    New Patient (Initial Visit)      Karen Fitzgerald is a 65 y.o. female.  HPI: Karen Fitzgerald is a very sweet 65 yo who is relatively healthy and says that she has an area on her back that becomes thickened and swollen at times and her husband is able to squeeze material out of the area. She denies ever having antibiotics specifically for this issue. It does not cause he stress or pain on a regular basis. She was referred by her PCP to discuss her options. She says there is no active swelling or drainage.   Past Medical History:  Diagnosis Date  . High cholesterol   . Hypertension     Past Surgical History:  Procedure Laterality Date  . COLONOSCOPY    . COLONOSCOPY N/A 11/22/2016   Procedure: COLONOSCOPY;  Surgeon: West Bali, MD;  Location: [facility];  Service: Endoscopy;  Laterality: N/A;  11:30 Am  . NECK SURGERY      Family History  Problem Relation Age of Onset  . Colon cancer Neg Hx     Social History   Tobacco Use  . Smoking status: Never Smoker  . Smokeless tobacco: Never Used  Substance Use Topics  . Alcohol use: No  . Drug use: No    Medications: I have reviewed the patient's current medications. Allergies as of 12/01/2020   No Known Allergies     Medication List       Accurate as of December 01, 2020 10:43 AM. If you have any questions, ask your nurse or doctor.        STOP taking these medications   cyclobenzaprine 10 MG tablet Commonly known as: FLEXERIL Stopped by: Lucretia Roers, MD   ibuprofen 600 MG tablet Commonly known as: ADVIL Stopped by: Lucretia Roers, MD   methocarbamol 500 MG tablet Commonly known as: ROBAXIN Stopped by: Lucretia Roers, MD     TAKE these medications   amLODipine 5 MG tablet Commonly known as: NORVASC Take 5 mg by mouth daily.   benazepril 10 MG  tablet Commonly known as: LOTENSIN Take 10 mg by mouth daily.   lovastatin 20 MG tablet Commonly known as: MEVACOR Take 20 mg by mouth at bedtime.        ROS:  A comprehensive review of systems was negative except for: Musculoskeletal: positive for neck pain boils  Blood pressure 117/69, pulse 87, temperature 98.2 F (36.8 C), temperature source Oral, resp. rate 12, height 5\' 3"  (1.6 m), weight 154 lb (69.9 kg), SpO2 97 %. Physical Exam Vitals reviewed.  Constitutional:      Appearance: She is normal weight.  HENT:     Head: Normocephalic.     Nose: Nose normal.     Mouth/Throat:     Mouth: Mucous membranes are moist.  Eyes:     Extraocular Movements: Extraocular movements intact.  Cardiovascular:     Rate and Rhythm: Normal rate and regular rhythm.  Pulmonary:     Effort: Pulmonary effort is normal.     Breath sounds: Normal breath sounds.  Abdominal:     General: There is no distension.     Palpations: Abdomen is soft.     Tenderness: There is no abdominal tenderness.  Musculoskeletal:        General: Normal range of motion.  Comments: Thoracic back with about 1.5cm area superficial cyst with central blackhead, nontender, no draingae  Skin:    General: Skin is warm.  Neurological:     General: No focal deficit present.     Mental Status: She is alert and oriented to person, place, and time.  Psychiatric:        Mood and Affect: Mood normal.        Behavior: Behavior normal.        Thought Content: Thought content normal.        Judgment: Judgment normal.     Results: None   Assessment & Plan:  Karen Fitzgerald is a 65 y.o. female with what appears to be a sebaceous cyst on her back that has been there for years and is not bothering her. We discussed that we can excise this if it is causing her issues or she wants it removed. Discussed risk of bleeding, infection, open wound, recurrence. Discussed potential for procedure in the office with local.   -Patient is going to think about her options, she does not want to get it removed yet   All questions were answered to the satisfaction of the patient.  Virl Cagey 12/01/2020, 10:43 AM

## 2020-12-01 NOTE — Patient Instructions (Signed)
Epidermal Cyst (Sebaceous cyst)   An epidermal cyst is a sac made of skin tissue. The sac contains a substance called keratin. Keratin is a protein that is normally secreted through the hair follicles. When keratin becomes trapped in the top layer of skin (epidermis), it can form an epidermal cyst. Epidermal cysts can be found anywhere on your body. These cysts are usually harmless (benign), and they may not cause symptoms unless they become infected. What are the causes? This condition may be caused by:  A blocked hair follicle.  A hair that curls and re-enters the skin instead of growing straight out of the skin (ingrown hair).  A blocked pore.  Irritated skin.  An injury to the skin.  Certain conditions that are passed along from parent to child (inherited).  Human papillomavirus (HPV).  Long-term (chronic) sun damage to the skin. What increases the risk? The following factors may make you more likely to develop an epidermal cyst:  Having acne.  Being overweight.  Being 59-38 years old. What are the signs or symptoms? The only symptom of this condition may be a small, painless lump underneath the skin. When an epidermal cyst ruptures, it may become infected. Symptoms may include:  Redness.  Inflammation.  Tenderness.  Warmth.  Fever.  Keratin draining from the cyst. Keratin is grayish-white, bad-smelling substance.  Pus draining from the cyst. How is this diagnosed? This condition is diagnosed with a physical exam.  In some cases, you may have a sample of tissue (biopsy) taken from your cyst to be examined under a microscope or tested for bacteria.  You may be referred to a health care provider who specializes in skin care (dermatologist). How is this treated? In many cases, epidermal cysts go away on their own without treatment. If a cyst becomes infected, treatment may include:  Opening and draining the cyst, done by a health care provider. After draining,  minor surgery to remove the rest of the cyst may be done.  Antibiotic medicine.  Injections of medicines (steroids) that help to reduce inflammation.  Surgery to remove the cyst. Surgery may be done if the cyst: ? Becomes large. ? Bothers you. ? Has a chance of turning into cancer.  Do not try to open a cyst yourself. Follow these instructions at home:  Take over-the-counter and prescription medicines only as told by your health care provider.  If you were prescribed an antibiotic medicine, take it it as told by your health care provider. Do not stop using the antibiotic even if you start to feel better.  Keep the area around your cyst clean and dry.  Wear loose, dry clothing.  Avoid touching your cyst.  Check your cyst every day for signs of infection. Check for: ? Redness, swelling, or pain. ? Fluid or blood. ? Warmth. ? Pus or a bad smell.  Keep all follow-up visits as told by your health care provider. This is important. How is this prevented?  Wear clean, dry, clothing.  Avoid wearing tight clothing.  Keep your skin clean and dry. Take showers or baths every day. Contact a health care provider if:  Your cyst develops symptoms of infection.  Your condition is not improving or is getting worse.  You develop a cyst that looks different from other cysts you have had.  You have a fever. Get help right away if:  Redness spreads from the cyst into the surrounding area. Summary  An epidermal cyst is a sac made of skin tissue.  These cysts are usually harmless (benign), and they may not cause symptoms unless they become infected.  If a cyst becomes infected, treatment may include surgery to open and drain the cyst, or to remove it. Treatment may also include medicines by mouth or through an injection.  Take over-the-counter and prescription medicines only as told by your health care provider. If you were prescribed an antibiotic medicine, take it as told by your  health care provider. Do not stop using the antibiotic even if you start to feel better.  Contact a health care provider if your condition is not improving or is getting worse.  Keep all follow-up visits as told by your health care provider. This is important. This information is not intended to replace advice given to you by your health care provider. Make sure you discuss any questions you have with your health care provider. Document Revised: 03/05/2019 Document Reviewed: 05/26/2018 Elsevier Patient Education  [place].   Epidermal Cyst Removal (Sebaceous cyst)   Epidermal cyst removal is a procedure to remove a sac of oily material (keratin) that has formed under your skin (epidermal cyst). Epidermal cysts may also be called epidermoid cysts, sebaceous cysts, or keratin cysts. Normally, the skin secretes this oily material through a gland or a hair follicle. However, when a skin gland or hair follicle becomes blocked, an epidermal cyst can form. You may need this procedure if you have an epidermal cyst that becomes large, uncomfortable, or infected. Tell a health care provider about:  Any allergies you have.  All medicines you are taking, including vitamins, herbs, eye drops, creams, and over-the-counter medicines.  Any problems you or family members have had with anesthetic medicines.  Any blood disorders you have.  Any surgeries you have had.  Any medical conditions you have now or have had.  Whether you are pregnant or may be pregnant. What are the risks? Generally, this is a safe procedure. However, problems may occur, including:  Development of another cyst.  Bleeding.  Infection.  Scarring. What happens before the procedure?  Ask your health care provider about: ? Changing or stopping your regular medicines. This is especially important if you are taking diabetes medicines or blood thinners. ? Taking medicines such as aspirin and ibuprofen. These  medicines can thin your blood. Do not take these medicines unless your health care provider tells you to take them. ? Taking over-the-counter medicines, vitamins, herbs, and supplements.  If you have an infected cyst, you may have to take antibiotic medicine before the cyst removal. Take your antibiotic as told by your health care provider. Do not stop taking the antibiotic even if you start to feel better.  Take a shower on the morning of your procedure. Your health care provider may ask you to use a germ-killing soap. What happens during the procedure?   You will be given a medicine to numb the area (local anesthetic).  The skin around the cyst will be cleaned with a germ-killing solution.  The health care provider will make a small incision in your skin over the cyst.  The health care provider will separate the cyst from the surrounding tissues that are under your skin.  If possible, the cyst will be removed undamaged (intact).  If the cyst bursts (ruptures), it will be removed in pieces.  After the cyst is removed, the health care provider will control any bleeding and close the incision with small stitches (sutures). Small incisions may not need sutures, and the  bleeding will be controlled by applying direct pressure with gauze.  The health care provider may apply antibiotic ointment and a bandage (dressing) over the incision. The procedure may vary among health care providers and hospitals. What happens after the procedure?  If your cyst ruptured during the procedure, you may need an antibiotic. If you are prescribed an antibiotic medicine or ointment, take or apply it as told by your health care provider. Do not stop using the antibiotic even if you start to feel better. Summary  Epidermal cyst removal is a procedure to remove a sac of oily material (keratin) that has formed under your skin (epidermal cyst).  You may need this procedure if you have an epidermal cyst that  becomes large, uncomfortable, or infected.  The health care provider will make a small incision in your skin to remove the cyst.  If you are prescribed an antibiotic medicine before the procedure, after the procedure, or both, use the antibiotic as told by your health care provider. Do not stop using the antibiotic even if you start to feel better. This information is not intended to replace advice given to you by your health care provider. Make sure you discuss any questions you have with your health care provider. Document Revised: 03/05/2019 Document Reviewed: 09/05/2017 Elsevier Patient Education  2020 [employer].

## 2021-02-28 DIAGNOSIS — E785 Hyperlipidemia, unspecified: Secondary | ICD-10-CM | POA: Diagnosis not present

## 2021-02-28 DIAGNOSIS — I10 Essential (primary) hypertension: Secondary | ICD-10-CM | POA: Diagnosis not present

## 2021-02-28 DIAGNOSIS — Z0000 Encounter for general adult medical examination without abnormal findings: Secondary | ICD-10-CM | POA: Diagnosis not present

## 2021-02-28 DIAGNOSIS — M79661 Pain in right lower leg: Secondary | ICD-10-CM | POA: Diagnosis not present

## 2021-06-26 DIAGNOSIS — E785 Hyperlipidemia, unspecified: Secondary | ICD-10-CM | POA: Diagnosis not present

## 2021-06-26 DIAGNOSIS — I10 Essential (primary) hypertension: Secondary | ICD-10-CM | POA: Diagnosis not present

## 2021-07-17 ENCOUNTER — Other Ambulatory Visit (HOSPITAL_COMMUNITY): Payer: Self-pay | Admitting: Family Medicine

## 2021-07-17 DIAGNOSIS — Z1231 Encounter for screening mammogram for malignant neoplasm of breast: Secondary | ICD-10-CM

## 2021-08-02 ENCOUNTER — Ambulatory Visit (HOSPITAL_COMMUNITY): Payer: Medicare HMO

## 2021-08-07 ENCOUNTER — Ambulatory Visit (HOSPITAL_COMMUNITY)
Admission: RE | Admit: 2021-08-07 | Discharge: 2021-08-07 | Disposition: A | Discharge status: Home / Self Care | Payer: Medicare HMO | Source: Ambulatory Visit | Attending: Family Medicine | Admitting: Family Medicine

## 2021-08-07 ENCOUNTER — Other Ambulatory Visit: Payer: Self-pay

## 2021-08-07 DIAGNOSIS — Z1231 Encounter for screening mammogram for malignant neoplasm of breast: Secondary | ICD-10-CM | POA: Diagnosis not present

## 2021-08-08 DIAGNOSIS — H5210 Myopia, unspecified eye: Secondary | ICD-10-CM | POA: Diagnosis not present

## 2021-09-04 DIAGNOSIS — R051 Acute cough: Secondary | ICD-10-CM | POA: Diagnosis not present

## 2021-09-04 DIAGNOSIS — R0982 Postnasal drip: Secondary | ICD-10-CM | POA: Diagnosis not present

## 2021-09-04 DIAGNOSIS — Z23 Encounter for immunization: Secondary | ICD-10-CM | POA: Diagnosis not present

## 2021-11-23 ENCOUNTER — Encounter: Payer: Self-pay | Admitting: *Deleted

## 2021-12-01 ENCOUNTER — Encounter: Payer: Self-pay | Admitting: *Deleted

## 2021-12-04 DIAGNOSIS — E785 Hyperlipidemia, unspecified: Secondary | ICD-10-CM | POA: Diagnosis not present

## 2021-12-04 DIAGNOSIS — I10 Essential (primary) hypertension: Secondary | ICD-10-CM | POA: Diagnosis not present

## 2022-01-15 ENCOUNTER — Ambulatory Visit: Payer: Medicare HMO

## 2022-01-23 ENCOUNTER — Ambulatory Visit (INDEPENDENT_AMBULATORY_CARE_PROVIDER_SITE_OTHER): Payer: Self-pay | Admitting: *Deleted

## 2022-01-23 ENCOUNTER — Other Ambulatory Visit: Payer: Self-pay

## 2022-01-23 VITALS — Ht 62.0 in | Wt 148.0 lb

## 2022-01-23 DIAGNOSIS — Z86010 Personal history of colonic polyps: Secondary | ICD-10-CM

## 2022-01-23 NOTE — Progress Notes (Addendum)
Gastroenterology Pre-Procedure Review  Request Date: 01/23/2022 Requesting Physician: 5-10 year recall, Last TCS 11/22/2016 by Dr. Oneida Alar, tubular adenoma  PATIENT REVIEW QUESTIONS: The patient responded to the following health history questions as indicated:    1. Diabetes Melitis: no 2. Joint replacements in the past 12 months: no 3. Major health problems in the past 3 months: no 4. Has an artificial valve or MVP: no 5. Has a defibrillator: no 6. Has been advised in past to take antibiotics in advance of a procedure like teeth cleaning: no 7. Family history of colon cancer: no  8. Alcohol Use: no 9. Illicit drug Use: no 10. History of sleep apnea: no  11. History of coronary artery or other vascular stents placed within the last 12 months: no 12. History of any prior anesthesia complications: no 13. Body mass index is 27.07 kg/m.    MEDICATIONS & ALLERGIES:    Patient reports the following regarding taking any blood thinners:   Plavix? no Aspirin? no Coumadin? no Brilinta? no Xarelto? no Eliquis? no Pradaxa? no Savaysa? no Effient? no  Patient confirms/reports the following medications:  Current Outpatient Medications  Medication Sig Dispense Refill   amLODipine (NORVASC) 5 MG tablet Take 5 mg by mouth daily.     benazepril (LOTENSIN) 10 MG tablet Take 10 mg by mouth daily.     lovastatin (MEVACOR) 20 MG tablet Take 20 mg by mouth at bedtime.     Multiple Vitamins-Minerals (MULTIVITAMIN GUMMIES ADULT) CHEW Chew by mouth daily at 6 (six) AM.     No current facility-administered medications for this visit.    Patient confirms/reports the following allergies:  No Known Allergies  No orders of the defined types were placed in this encounter.   AUTHORIZATION INFORMATION Primary Insurance: [employer],  ID #: [id number],  Group #: [id number] Pre-Cert / Josem Kaufmann required:  Pre-Cert / Auth #: No, not required  SCHEDULE INFORMATION: Procedure has been scheduled as  follows:  Date: 02/13/2022, Time: 1:30 Location: [facility] with Dr. Abbey Chatters  This Gastroenterology Pre-Precedure Review Form is being routed to the following provider(s): Roseanne Kaufman, NP

## 2022-01-31 ENCOUNTER — Encounter: Payer: Self-pay | Admitting: *Deleted

## 2022-01-31 MED ORDER — PEG 3350-KCL-NA BICARB-NACL 420 G PO SOLR: 4000.0000 mL | Freq: Once | ORAL | 0 refills | Status: AC

## 2022-01-31 NOTE — Addendum Note (Signed)
Addended by: Metro Kung on: 01/31/2022 01:50 PM   Modules accepted: Orders

## 2022-01-31 NOTE — Progress Notes (Signed)
Appropriate. ASA 2.  

## 2022-01-31 NOTE — Progress Notes (Signed)
Spoke to pt.  Scheduled procedure for 02/13/2022 at 1:30, arrival at 12:00 at [facility].  Reviewed prep instructions with pt by phone. Pt made aware to pick up prep kit at pharmacy.  She was advised to get OTC items (dulcolax and fleet enema).  Pt aware that I am mailing out prep instructions.  Confirmed mailing address.

## 2022-02-13 ENCOUNTER — Encounter (HOSPITAL_COMMUNITY): Payer: Self-pay

## 2022-02-13 ENCOUNTER — Encounter (HOSPITAL_COMMUNITY)
Admission: RE | Disposition: A | Discharge status: Home / Self Care | Payer: Self-pay | Source: Home / Self Care | Attending: Internal Medicine

## 2022-02-13 ENCOUNTER — Ambulatory Visit (HOSPITAL_BASED_OUTPATIENT_CLINIC_OR_DEPARTMENT_OTHER): Payer: Medicare HMO | Admitting: Anesthesiology

## 2022-02-13 ENCOUNTER — Ambulatory Visit (HOSPITAL_COMMUNITY)
Admission: RE | Admit: 2022-02-13 | Discharge: 2022-02-13 | Disposition: A | Discharge status: Home / Self Care | Payer: Medicare HMO | Attending: Internal Medicine | Admitting: Internal Medicine

## 2022-02-13 ENCOUNTER — Ambulatory Visit (HOSPITAL_COMMUNITY): Payer: Medicare HMO | Admitting: Anesthesiology

## 2022-02-13 ENCOUNTER — Other Ambulatory Visit: Payer: Self-pay

## 2022-02-13 DIAGNOSIS — K635 Polyp of colon: Secondary | ICD-10-CM

## 2022-02-13 DIAGNOSIS — D122 Benign neoplasm of ascending colon: Secondary | ICD-10-CM | POA: Diagnosis not present

## 2022-02-13 DIAGNOSIS — Z86010 Personal history of colonic polyps: Secondary | ICD-10-CM

## 2022-02-13 DIAGNOSIS — K648 Other hemorrhoids: Secondary | ICD-10-CM | POA: Insufficient documentation

## 2022-02-13 DIAGNOSIS — I10 Essential (primary) hypertension: Secondary | ICD-10-CM | POA: Diagnosis not present

## 2022-02-13 DIAGNOSIS — Z1211 Encounter for screening for malignant neoplasm of colon: Secondary | ICD-10-CM | POA: Diagnosis not present

## 2022-02-13 HISTORY — PX: POLYPECTOMY: SHX5525

## 2022-02-13 HISTORY — PX: COLONOSCOPY WITH PROPOFOL: SHX5780

## 2022-02-13 SURGERY — COLONOSCOPY WITH PROPOFOL
Anesthesia: General

## 2022-02-13 MED ORDER — PROPOFOL 10 MG/ML IV BOLUS
INTRAVENOUS | Status: DC | PRN
  Administered 2022-02-13: 150 mg via INTRAVENOUS
  Administered 2022-02-13 (×2): 50 mg via INTRAVENOUS

## 2022-02-13 MED ORDER — LIDOCAINE HCL (CARDIAC) PF 50 MG/5ML IV SOSY
PREFILLED_SYRINGE | INTRAVENOUS | Status: DC | PRN
  Administered 2022-02-13: 50 mg via INTRAVENOUS

## 2022-02-13 MED ORDER — CHLORHEXIDINE GLUCONATE CLOTH 2 % EX PADS: 6.0000 | MEDICATED_PAD | Freq: Once | CUTANEOUS | Status: DC

## 2022-02-13 MED ORDER — LACTATED RINGERS IV SOLN: INTRAVENOUS | Status: DC

## 2022-02-13 NOTE — Anesthesia Postprocedure Evaluation (Signed)
Anesthesia Post Note ? ?Patient: Karen Fitzgerald ? ?Procedure(s) Performed: COLONOSCOPY WITH PROPOFOL ?POLYPECTOMY ? ?Patient location during evaluation: Phase II ?Anesthesia Type: General ?Level of consciousness: awake and alert and oriented ?Pain management: pain level controlled ?Vital Signs Assessment: post-procedure vital signs reviewed and stable ?Respiratory status: spontaneous breathing, nonlabored ventilation and respiratory function stable ?Cardiovascular status: blood pressure returned to baseline and stable ?Postop Assessment: no apparent nausea or vomiting ?Anesthetic complications: no ? ? ?No notable events documented. ? ? ?Last Vitals:  ?Vitals:  ? 02/13/22 0941 02/13/22 1124  ?BP: 132/83 101/65  ?Pulse: 87 87  ?Resp: (!) 22 19  ?Temp: 36.9 ?C 36.9 ?C  ?SpO2: 98% 98%  ?  ?Last Pain:  ?Vitals:  ? 02/13/22 1124  ?TempSrc: Axillary  ?PainSc: 0-No pain  ? ? ?  ?  ?  ?  ?  ?  ? ?Kekai Geter C Ryett Hamman ? ? ? ? ?

## 2022-02-13 NOTE — Anesthesia Preprocedure Evaluation (Addendum)
Anesthesia Evaluation  ?Patient identified by MRN, date of birth, ID band ?Patient awake ? ? ? ?Reviewed: ?Allergy & Precautions, NPO status , Patient's Chart, lab work & pertinent test results ? ?Airway ?Mallampati: II ? ?TM Distance: >3 FB ?Neck ROM: Full ? ? ? Dental ? ?(+) Dental Advisory Given, Partial Lower, Partial Upper, Missing ?Partial upper dentures were removed:   ?Pulmonary ?neg pulmonary ROS,  ?  ?Pulmonary exam normal ?breath sounds clear to auscultation ? ? ? ? ? ? Cardiovascular ?Exercise Tolerance: Good ?hypertension, Pt. on medications ?Normal cardiovascular exam ?Rhythm:Regular Rate:Normal ? ? ?  ?Neuro/Psych ?negative neurological ROS ? negative psych ROS  ? GI/Hepatic ?negative GI ROS, Neg liver ROS,   ?Endo/Other  ?negative endocrine ROS ? Renal/GU ?negative Renal ROS  ?negative genitourinary ?  ?Musculoskeletal ?negative musculoskeletal ROS ?(+)  ? Abdominal ?  ?Peds ?negative pediatric ROS ?(+)  Hematology ?negative hematology ROS ?(+)   ?Anesthesia Other Findings ?Neck sx ? Reproductive/Obstetrics ?negative OB ROS ? ?  ? ? ? ? ? ? ? ? ? ? ? ? ? ?  ?  ? ? ? ? ? ? ? ?Anesthesia Physical ?Anesthesia Plan ? ?ASA: 2 ? ?Anesthesia Plan: General  ? ?Post-op Pain Management: Minimal or no pain anticipated  ? ?Induction: Intravenous ? ?PONV Risk Score and Plan: Treatment may vary due to age or medical condition ? ?Airway Management Planned: Nasal Cannula and Natural Airway ? ?Additional Equipment:  ? ?Intra-op Plan:  ? ?Post-operative Plan:  ? ?Informed Consent: I have reviewed the patients History and Physical, chart, labs and discussed the procedure including the risks, benefits and alternatives for the proposed anesthesia with the patient or authorized representative who has indicated his/her understanding and acceptance.  ? ? ? ?Dental advisory given ? ?Plan Discussed with: CRNA and Surgeon ? ?Anesthesia Plan Comments:   ? ? ? ? ? ? ?Anesthesia Quick  Evaluation ? ?

## 2022-02-13 NOTE — Anesthesia Procedure Notes (Signed)
Date/Time: 02/13/2022 11:02 AM ?Performed by: Vista Deck, CRNA ?Pre-anesthesia Checklist: Patient identified, Emergency Drugs available, Suction available, Timeout performed and Patient being monitored ?Patient Re-evaluated:Patient Re-evaluated prior to induction ?Oxygen Delivery Method: Nasal Cannula ? ? ? ? ?

## 2022-02-13 NOTE — Op Note (Signed)
[facility] ?Patient Name: Karen Fitzgerald ?Procedure Date: 02/13/2022 10:52 AM ?MRN: [id number] ?Date of Birth: 10-02-1956 ?Attending MD: Elon Alas. Abbey Chatters , DO ?CSN: [id number] ?Age: 66 ?Admit Type: Outpatient ?Procedure:                Colonoscopy ?Indications:              Surveillance: Personal history of adenomatous  ?                          polyps on last colonoscopy 5 years ago ?Providers:                Elon Alas. Abbey Chatters, DO, Tammy Vaught, RN, Minette Headland  ?                          Maurine Simmering, Technician ?Referring MD:              ?Medicines:                See the Anesthesia note for documentation of the  ?                          administered medications ?Complications:            No immediate complications. ?Estimated Blood Loss:     Estimated blood loss was minimal. ?Procedure:                Pre-Anesthesia Assessment: ?                          - The anesthesia plan was to use monitored  ?                          anesthesia care (MAC). ?                          After obtaining informed consent, the colonoscope  ?                          was passed under direct vision. Throughout the  ?                          procedure, the patient's blood pressure, pulse, and  ?                          oxygen saturations were monitored continuously. The  ?                          PCF-HQ190L ([id number]) scope was introduced through  ?                          the anus and advanced to the the cecum, identified  ?                          by appendiceal orifice and ileocecal valve. The  ?                          colonoscopy was performed  without difficulty. The  ?                          patient tolerated the procedure well. The quality  ?                          of the bowel preparation was evaluated using the  ?                          BBPS [facility] Bowel Preparation Scale) with scores  ?                          of: Right Colon = 3, Transverse Colon = 3 and Left  ?                          Colon = 3  (entire mucosa seen well with no residual  ?                          staining, small fragments of stool or opaque  ?                          liquid). The total BBPS score equals 9. ?Scope In: 11:07:11 AM ?Scope Out: 11:19:21 AM ?Scope Withdrawal Time: 0 hours 8 minutes 54 seconds  ?Total Procedure Duration: 0 hours 12 minutes 10 seconds  ?Findings: ?     The perianal and digital rectal examinations were normal. ?     Non-bleeding internal hemorrhoids were found during endoscopy. ?     A 4 mm polyp was found in the ascending colon. The polyp was sessile.  ?     The polyp was removed with a cold snare. Resection and retrieval were  ?     complete. ?     The exam was otherwise without abnormality. ?Impression:               - Non-bleeding internal hemorrhoids. ?                          - One 4 mm polyp in the ascending colon, removed  ?                          with a cold snare. Resected and retrieved. ?                          - The examination was otherwise normal. ?Moderate Sedation: ?     Per Anesthesia Care ?Recommendation:           - Patient has a contact number available for  ?                          emergencies. The signs and symptoms of potential  ?                          delayed complications were discussed with the  ?  patient. Return to normal activities tomorrow.  ?                          Written discharge instructions were provided to the  ?                          patient. ?                          - Resume previous diet. ?                          - Continue present medications. ?                          - Await pathology results. ?                          - Repeat colonoscopy in 5 years for surveillance. ?                          - Return to GI clinic PRN. ?Procedure Code(s):        --- Professional --- ?                          [phone], Colonoscopy, flexible; with removal of  ?                          tumor(s), polyp(s), or other lesion(s) by snare  ?                           technique ?Diagnosis Code(s):        --- Professional --- ?                          Z86.010, Personal history of colonic polyps ?                          K63.5, Polyp of colon ?                          K64.8, Other hemorrhoids ?CPT copyright 2019 American Medical Association. All rights reserved. ?The codes documented in this report are preliminary and upon coder review may  ?be revised to meet current compliance requirements. ?Elon Alas. Abbey Chatters, DO ?Elon Alas. Karen Mcmullen, DO ?02/13/2022 11:21:51 AM ?This report has been signed electronically. ?Number of Addenda: 0 ?

## 2022-02-13 NOTE — Discharge Instructions (Addendum)
?  Colonoscopy Discharge Instructions  Read the instructions outlined below and refer to this sheet in the next few weeks. These discharge instructions provide you with general information on caring for yourself after you leave the hospital. Your doctor may also give you specific instructions. While your treatment has been planned according to the most current medical practices available, unavoidable complications occasionally occur.   ACTIVITY You may resume your regular activity, but move at a slower pace for the next 24 hours.  Take frequent rest periods for the next 24 hours.  Walking will help get rid of the air and reduce the bloated feeling in your belly (abdomen).  No driving for 24 hours (because of the medicine (anesthesia) used during the test).   Do not sign any important legal documents or operate any machinery for 24 hours (because of the anesthesia used during the test).  NUTRITION Drink plenty of fluids.  You may resume your normal diet as instructed by your doctor.  Begin with a light meal and progress to your normal diet. Heavy or fried foods are harder to digest and may make you feel sick to your stomach (nauseated).  Avoid alcoholic beverages for 24 hours or as instructed.  MEDICATIONS You may resume your normal medications unless your doctor tells you otherwise.  WHAT YOU CAN EXPECT TODAY Some feelings of bloating in the abdomen.  Passage of more gas than usual.  Spotting of blood in your stool or on the toilet paper.  IF YOU HAD POLYPS REMOVED DURING THE COLONOSCOPY: No aspirin products for 7 days or as instructed.  No alcohol for 7 days or as instructed.  Eat a soft diet for the next 24 hours.  FINDING OUT THE RESULTS OF YOUR TEST Not all test results are available during your visit. If your test results are not back during the visit, make an appointment with your caregiver to find out the results. Do not assume everything is normal if you have not heard from your  caregiver or the medical facility. It is important for you to follow up on all of your test results.  SEEK IMMEDIATE MEDICAL ATTENTION IF: You have more than a spotting of blood in your stool.  Your belly is swollen (abdominal distention).  You are nauseated or vomiting.  You have a temperature over 101.  You have abdominal pain or discomfort that is severe or gets worse throughout the day.   Your colonoscopy revealed 1 polyp(s) which I removed successfully. Await pathology results, my office will contact you. I recommend repeating colonoscopy in 5 years for surveillance purposes. Otherwise follow up with Gi as needed.    I hope you have a great rest of your week!  Charles K. Carver, D.O. Gastroenterology and Hepatology [facility]  

## 2022-02-13 NOTE — H&P (Signed)
Primary Care Physician:  Iona Beard, MD ?Primary Gastroenterologist:  Dr. Abbey Chatters ? ?Pre-Procedure History & Physical: ?HPI:  Karen Fitzgerald is a 66 y.o. female is here for a colonoscopy to be performed for surveillance purposes. Last TCS 11/22/2016 by Dr. Oneida Alar, tubular adenoma ? ?Past Medical History:  ?Diagnosis Date  ? High cholesterol   ? Hypertension   ? ? ?Past Surgical History:  ?Procedure Laterality Date  ? COLONOSCOPY    ? COLONOSCOPY N/A 11/22/2016  ? Procedure: COLONOSCOPY;  Surgeon: Danie Binder, MD;  [facility];  Service: Endoscopy;  Laterality: N/A;  11:30 Am  ? NECK SURGERY    ? ? ?Prior to Admission medications   ?Medication Sig Start Date End Date Taking? Authorizing Provider  ?amLODipine (NORVASC) 5 MG tablet Take 5 mg by mouth daily.   Yes [provider]  ?benazepril (LOTENSIN) 10 MG tablet Take 10 mg by mouth daily.   Yes [provider]  ?lovastatin (MEVACOR) 20 MG tablet Take 20 mg by mouth at bedtime.   Yes [provider]  ?Multiple Vitamins-Minerals (MULTIVITAMIN GUMMIES ADULT) CHEW Chew 2 each by mouth daily at 6 (six) AM.   Yes [provider]  ? ? ?Allergies as of 01/31/2022  ? (No Known Allergies)  ? ? ?Family History  ?Problem Relation Age of Onset  ? Colon cancer Neg Hx   ? ? ?Social History  ? ?Socioeconomic History  ? Marital status: Married  ?  Spouse name: Not on file  ? Number of children: Not on file  ? Years of education: Not on file  ? Highest education level: Not on file  ?Occupational History  ? Not on file  ?Tobacco Use  ? Smoking status: Never  ? Smokeless tobacco: Never  ?Substance and Sexual Activity  ? Alcohol use: No  ? Drug use: No  ? Sexual activity: Not on file  ?Other Topics Concern  ? Not on file  ?Social History Narrative  ? Not on file  ? ?Social Determinants of Health  ? ?Financial Resource Strain: Not on file  ?Food Insecurity: Not on file  ?Transportation Needs: Not on file  ?Physical Activity: Not on  file  ?Stress: Not on file  ?Social Connections: Not on file  ?Intimate Partner Violence: Not on file  ? ? ?Review of Systems: ?See HPI, otherwise negative ROS ? ?Physical Exam: ?Vital signs in last 24 hours: ?Temp:  [98.4 ?F (36.9 ?C)] 98.4 ?F (36.9 ?C) (03/21 0941) ?Pulse Rate:  [87] 87 (03/21 0941) ?Resp:  [22] 22 (03/21 0941) ?BP: (132)/(83) 132/83 (03/21 0941) ?SpO2:  [98 %] 98 % (03/21 0941) ?Weight:  [64.4 kg] 64.4 kg (03/21 0941) ?  ?General:   Alert,  Well-developed, well-nourished, pleasant and cooperative in NAD ?Head:  Normocephalic and atraumatic. ?Eyes:  Sclera clear, no icterus.   Conjunctiva pink. ?Ears:  Normal auditory acuity. ?Nose:  No deformity, discharge,  or lesions. ?Mouth:  No deformity or lesions, dentition normal. ?Neck:  Supple; no masses or thyromegaly. ?Lungs:  Clear throughout to auscultation.   No wheezes, crackles, or rhonchi. No acute distress. ?Heart:  Regular rate and rhythm; no murmurs, clicks, rubs,  or gallops. ?Abdomen:  Soft, nontender and nondistended. No masses, hepatosplenomegaly or hernias noted. Normal bowel sounds, without guarding, and without rebound.   ?Msk:  Symmetrical without gross deformities. Normal posture. ?Extremities:  Without clubbing or edema. ?Neurologic:  Alert and  oriented x4;  grossly normal neurologically. ?Skin:  Intact without significant  lesions or rashes. ?Cervical Nodes:  No significant cervical adenopathy. ?Psych:  Alert and cooperative. Normal mood and affect. ? ?Impression/Plan: ?Karen Fitzgerald is here for a colonoscopy to be performed for surveillance purposes. Last TCS 11/22/2016 by Dr. Oneida Alar, tubular adenoma ? ?The risks of the procedure including infection, bleed, or perforation as well as benefits, limitations, alternatives and imponderables have been reviewed with the patient. Questions have been answered. All parties agreeable. ? ?

## 2022-02-13 NOTE — Transfer of Care (Signed)
Immediate Anesthesia Transfer of Care Note ? ?Patient: Karen Fitzgerald ? ?Procedure(s) Performed: COLONOSCOPY WITH PROPOFOL ?POLYPECTOMY ? ?Patient Location: Endoscopy Unit ? ?Anesthesia Type:General ? ?Level of Consciousness: drowsy ? ?Airway & Oxygen Therapy: Patient Spontanous Breathing ? ?Post-op Assessment: Report given to RN and Post -op Vital signs reviewed and stable ? ?Post vital signs: Reviewed and stable88/49 ? ?Last Vitals:  ?Vitals Value Taken Time  ?BP 88/49   ?Temp 36.9 ?C 02/13/22 1124  ?Pulse 87 02/13/22 1124  ?Resp 19 02/13/22 1124  ?SpO2 98 % 02/13/22 1124  ? ? ?Last Pain:  ?Vitals:  ? 02/13/22 1124  ?TempSrc: Axillary  ?PainSc:   ?   ? ?Patients Stated Pain Goal: 7 (02/13/22 0941) ? ?Complications: No notable events documented. ?

## 2022-02-14 LAB — SURGICAL PATHOLOGY

## 2022-02-15 ENCOUNTER — Encounter (HOSPITAL_COMMUNITY): Payer: Self-pay | Admitting: Internal Medicine

## 2022-07-03 ENCOUNTER — Other Ambulatory Visit (HOSPITAL_COMMUNITY): Payer: Self-pay | Admitting: Family Medicine

## 2022-07-03 DIAGNOSIS — Z1231 Encounter for screening mammogram for malignant neoplasm of breast: Secondary | ICD-10-CM

## 2022-08-10 ENCOUNTER — Ambulatory Visit (HOSPITAL_COMMUNITY)
Admission: RE | Admit: 2022-08-10 | Discharge: 2022-08-10 | Disposition: A | Discharge status: Home / Self Care | Payer: Medicare HMO | Source: Ambulatory Visit | Attending: Family Medicine | Admitting: Family Medicine

## 2022-08-10 DIAGNOSIS — Z1231 Encounter for screening mammogram for malignant neoplasm of breast: Secondary | ICD-10-CM | POA: Insufficient documentation

## 2022-10-16 DIAGNOSIS — E785 Hyperlipidemia, unspecified: Secondary | ICD-10-CM | POA: Diagnosis not present

## 2022-10-16 DIAGNOSIS — I10 Essential (primary) hypertension: Secondary | ICD-10-CM | POA: Diagnosis not present

## 2022-10-16 DIAGNOSIS — Z6826 Body mass index (BMI) 26.0-26.9, adult: Secondary | ICD-10-CM | POA: Diagnosis not present

## 2023-01-15 DIAGNOSIS — I10 Essential (primary) hypertension: Secondary | ICD-10-CM | POA: Diagnosis not present

## 2023-01-15 DIAGNOSIS — E7800 Pure hypercholesterolemia, unspecified: Secondary | ICD-10-CM | POA: Diagnosis not present

## 2023-04-16 DIAGNOSIS — Z6825 Body mass index (BMI) 25.0-25.9, adult: Secondary | ICD-10-CM | POA: Diagnosis not present

## 2023-04-16 DIAGNOSIS — J069 Acute upper respiratory infection, unspecified: Secondary | ICD-10-CM | POA: Diagnosis not present

## 2023-04-16 DIAGNOSIS — E7800 Pure hypercholesterolemia, unspecified: Secondary | ICD-10-CM | POA: Diagnosis not present

## 2023-04-16 DIAGNOSIS — I10 Essential (primary) hypertension: Secondary | ICD-10-CM | POA: Diagnosis not present

## 2023-04-16 DIAGNOSIS — E785 Hyperlipidemia, unspecified: Secondary | ICD-10-CM | POA: Diagnosis not present

## 2023-04-22 DIAGNOSIS — Z1212 Encounter for screening for malignant neoplasm of rectum: Secondary | ICD-10-CM | POA: Diagnosis not present

## 2023-04-22 DIAGNOSIS — Z1211 Encounter for screening for malignant neoplasm of colon: Secondary | ICD-10-CM | POA: Diagnosis not present

## 2023-06-21 DIAGNOSIS — Z8249 Family history of ischemic heart disease and other diseases of the circulatory system: Secondary | ICD-10-CM | POA: Diagnosis not present

## 2023-06-21 DIAGNOSIS — Z008 Encounter for other general examination: Secondary | ICD-10-CM | POA: Diagnosis not present

## 2023-06-21 DIAGNOSIS — N181 Chronic kidney disease, stage 1: Secondary | ICD-10-CM | POA: Diagnosis not present

## 2023-06-21 DIAGNOSIS — R519 Headache, unspecified: Secondary | ICD-10-CM | POA: Diagnosis not present

## 2023-06-21 DIAGNOSIS — E785 Hyperlipidemia, unspecified: Secondary | ICD-10-CM | POA: Diagnosis not present

## 2023-06-21 DIAGNOSIS — I129 Hypertensive chronic kidney disease with stage 1 through stage 4 chronic kidney disease, or unspecified chronic kidney disease: Secondary | ICD-10-CM | POA: Diagnosis not present

## 2023-06-21 DIAGNOSIS — Z809 Family history of malignant neoplasm, unspecified: Secondary | ICD-10-CM | POA: Diagnosis not present

## 2023-07-09 ENCOUNTER — Other Ambulatory Visit (HOSPITAL_COMMUNITY): Payer: Self-pay | Admitting: Family Medicine

## 2023-07-09 DIAGNOSIS — J069 Acute upper respiratory infection, unspecified: Secondary | ICD-10-CM | POA: Diagnosis not present

## 2023-07-09 DIAGNOSIS — Z1231 Encounter for screening mammogram for malignant neoplasm of breast: Secondary | ICD-10-CM

## 2023-07-22 DIAGNOSIS — J069 Acute upper respiratory infection, unspecified: Secondary | ICD-10-CM | POA: Diagnosis not present

## 2023-07-22 DIAGNOSIS — Z0000 Encounter for general adult medical examination without abnormal findings: Secondary | ICD-10-CM | POA: Diagnosis not present

## 2023-07-22 DIAGNOSIS — R21 Rash and other nonspecific skin eruption: Secondary | ICD-10-CM | POA: Diagnosis not present

## 2023-08-14 ENCOUNTER — Ambulatory Visit (HOSPITAL_COMMUNITY)
Admission: RE | Admit: 2023-08-14 | Discharge: 2023-08-14 | Disposition: A | Discharge status: Home / Self Care | Payer: Medicare HMO | Source: Ambulatory Visit | Attending: Family Medicine | Admitting: Family Medicine

## 2023-08-14 DIAGNOSIS — Z1231 Encounter for screening mammogram for malignant neoplasm of breast: Secondary | ICD-10-CM | POA: Insufficient documentation

## 2023-10-10 DIAGNOSIS — M25511 Pain in right shoulder: Secondary | ICD-10-CM | POA: Diagnosis not present

## 2023-11-11 DIAGNOSIS — E7800 Pure hypercholesterolemia, unspecified: Secondary | ICD-10-CM | POA: Diagnosis not present

## 2023-11-11 DIAGNOSIS — E785 Hyperlipidemia, unspecified: Secondary | ICD-10-CM | POA: Diagnosis not present

## 2023-11-11 DIAGNOSIS — I10 Essential (primary) hypertension: Secondary | ICD-10-CM | POA: Diagnosis not present

## 2024-01-21 DIAGNOSIS — L723 Sebaceous cyst: Secondary | ICD-10-CM | POA: Diagnosis not present

## 2024-01-21 DIAGNOSIS — R051 Acute cough: Secondary | ICD-10-CM | POA: Diagnosis not present

## 2024-01-25 ENCOUNTER — Ambulatory Visit
Admission: EM | Admit: 2024-01-25 | Discharge: 2024-01-25 | Disposition: A | Discharge status: Home / Self Care | Attending: Nurse Practitioner | Admitting: Nurse Practitioner

## 2024-01-25 DIAGNOSIS — L089 Local infection of the skin and subcutaneous tissue, unspecified: Secondary | ICD-10-CM

## 2024-01-25 DIAGNOSIS — L723 Sebaceous cyst: Secondary | ICD-10-CM

## 2024-01-25 MED ORDER — CHLORHEXIDINE GLUCONATE 4 % EX SOLN: Freq: Every day | CUTANEOUS | 0 refills | Status: AC | PRN

## 2024-01-25 MED ORDER — DOXYCYCLINE HYCLATE 100 MG PO TABS: 100.0000 mg | ORAL_TABLET | Freq: Two times a day (BID) | ORAL | 0 refills | Status: AC

## 2024-01-25 NOTE — ED Triage Notes (Signed)
 Pt reports she has a cyst on her upper back that has burst x 1 day.

## 2024-01-25 NOTE — ED Provider Notes (Signed)
 [facility]    CSN: [id number] Arrival date & time: 01/25/24  1255      History   Chief Complaint No chief complaint on file.   HPI Karen Fitzgerald is a 68 y.o. female.   The history is provided by the patient.   Patient presents for complaints of a cyst that it started draining on her back.  Patient states this has been there for almost 1 week.  She states over the past 24 hours, the cyst "burst open and began draining."  Patient states that the area is painful.  She denies fever, chills, chest pain, abdominal pain, nausea, vomiting, diarrhea, or rash.  Patient states she did speak with her PCP who told her she needed to be seen.  She was planning to follow-up with her PCP next week.  Past Medical History:  Diagnosis Date   High cholesterol    Hypertension     Patient Active Problem List   Diagnosis Date Noted   Sebaceous cyst 12/01/2020   Special screening for malignant neoplasms, colon     Past Surgical History:  Procedure Laterality Date   COLONOSCOPY     COLONOSCOPY N/A 11/22/2016   Procedure: COLONOSCOPY;  Surgeon: West Bali, MD;  [facility];  Service: Endoscopy;  Laterality: N/A;  11:30 Am   COLONOSCOPY WITH PROPOFOL N/A 02/13/2022   Procedure: COLONOSCOPY WITH PROPOFOL;  Surgeon: Lanelle Bal, DO;  [facility];  Service: Endoscopy;  Laterality: N/A;  1:30 / ASA 2   NECK SURGERY     POLYPECTOMY  02/13/2022   Procedure: POLYPECTOMY;  Surgeon: Lanelle Bal, DO;  [facility];  Service: Endoscopy;;    OB History   No obstetric history on file.      Home Medications    Prior to Admission medications   Medication Sig Start Date End Date Taking? Authorizing Provider  amLODipine (NORVASC) 5 MG tablet Take 5 mg by mouth daily.    [provider]  benazepril (LOTENSIN) 10 MG tablet Take 10 mg by mouth daily.    [provider]  lovastatin (MEVACOR) 20 MG tablet Take 20 mg by mouth at  bedtime.    [provider]  Multiple Vitamins-Minerals (MULTIVITAMIN GUMMIES ADULT) CHEW Chew 2 each by mouth daily at 6 (six) AM.    [provider]    Family History Family History  Problem Relation Age of Onset   Colon cancer Neg Hx     Social History Social History   Tobacco Use   Smoking status: Never   Smokeless tobacco: Never  Substance Use Topics   Alcohol use: No   Drug use: No     Allergies   Patient has no known allergies.   Review of Systems Review of Systems Per HPI  Physical Exam Triage Vital Signs ED Triage Vitals  Encounter Vitals Group     BP 01/25/24 1354 132/84     Systolic BP Percentile --      Diastolic BP Percentile --      Pulse Rate 01/25/24 1354 (!) 101     Resp 01/25/24 1354 16     Temp 01/25/24 1354 98.1 F (36.7 C)     Temp Source 01/25/24 1354 Oral     SpO2 01/25/24 1354 95 %     Weight --      Height --      Head Circumference --      Peak Flow --  Pain Score 01/25/24 1356 5     Pain Loc --      Pain Education --      Exclude from Growth Chart --    No data found.  Updated Vital Signs BP 132/84 (BP Location: Right Arm)   Pulse (!) 101   Temp 98.1 F (36.7 C) (Oral)   Resp 16   SpO2 95%   Visual Acuity Right Eye Distance:   Left Eye Distance:   Bilateral Distance:    Right Eye Near:   Left Eye Near:    Bilateral Near:     Physical Exam Vitals and nursing note reviewed.  Constitutional:      General: She is not in acute distress.    Appearance: Normal appearance.  HENT:     Head: Normocephalic.  Eyes:     Extraocular Movements: Extraocular movements intact.     Pupils: Pupils are equal, round, and reactive to light.  Cardiovascular:     Rate and Rhythm: Regular rhythm. Tachycardia present.     Pulses: Normal pulses.     Heart sounds: Normal heart sounds.  Pulmonary:     Effort: Pulmonary effort is normal.     Breath sounds: Normal breath sounds.  Abdominal:     General: Bowel  sounds are normal.     Palpations: Abdomen is soft.  Musculoskeletal:     Cervical back: Normal range of motion.  Skin:    General: Skin is warm and dry.       Neurological:     General: No focal deficit present.     Mental Status: She is alert and oriented to person, place, and time.  Psychiatric:        Mood and Affect: Mood normal.        Behavior: Behavior normal.      UC Treatments / Results  Labs (all labs ordered are listed, but only abnormal results are displayed) Labs Reviewed - No data to display  EKG   Radiology No results found.  Procedures Incision and Drainage  Date/Time: 01/25/2024 3:10 PM  Performed by: Abran Cantor, NP Authorized by: Abran Cantor, NP   Consent:    Consent obtained:  Verbal   Consent given by:  Patient   Risks discussed:  Bleeding, incomplete drainage and pain Universal protocol:    Procedure explained and questions answered to patient or proxy's satisfaction: yes     Patient identity confirmed:  Verbally with patient Location:    Type:  Abscess   Size:  2-3cm   Location:  Trunk Pre-procedure details:    Procedure prep: Hibiclens soap and normal saline. Anesthesia:    Anesthesia method:  Local infiltration   Local anesthetic:  Lidocaine 2% w/o epi (4ml) Procedure details:    Drainage amount:  Moderate   Wound treatment:  Wound left open   Packing materials:  None Comments:     2 to 3 cm infected sebaceous cyst noted to the right upper back.  Site cleaned with Hibiclens soap and normal saline.  Patient presented with cyst currently draining.  #11 blade used to place incision.  Continue mucopurulent drainage expectorated.  Site was cleaned post procedurally.  Antibiotic ointment with gauze dressing placed.  Patient tolerated the procedure well.  (including critical care time)  Medications Ordered in UC Medications - No data to display  Initial Impression / Assessment and Plan / UC Course  I have  reviewed the triage vital signs and the nursing notes.  Pertinent labs & imaging results that were available during my care of the patient were reviewed by me and considered in my medical decision making (see chart for details).  I&D performed of infected sebaceous cyst.  Moderate mucopurulent drainage expectorated.  Patient tolerated the procedure well.  Will start doxycycline 100 mg twice daily for the next 7 days along with use of Hibiclens 4% external liquid to cleanse the affected area.  Supportive care recommendations were provided and discussed with the patient to include over-the-counter analgesics, warm compresses, and to monitor for worsening symptoms.  Patient was advised to follow-up with PCP this week for reevaluation.  Patient was given strict ER follow-up precautions.  Patient was in agreement with this plan of care and verbalized understanding.  All questions were answered.  Patient stable for discharge.  Final Clinical Impressions(s) / UC Diagnoses   Final diagnoses:  None   Discharge Instructions   None    ED Prescriptions   None    PDMP not reviewed this encounter.   Abran Cantor, NP 01/26/24 1621

## 2024-01-25 NOTE — ED Notes (Signed)
 Applied mupiricin ointment, non adherent gauze, and white cloth tape to patients abcess on upper right back.

## 2024-01-25 NOTE — Discharge Instructions (Signed)
 Take medication as prescribed. Warm compresses to the affected area 3-4 times daily. Clean the area at least twice daily with the antibiotic soap prescribed today.   Keep the area covered while it is draining. Follow-up in this clinic if you continue to experience pain is, increased swelling, or other concerns. Go to the emergency department if you develop fever, chills, generalized fatigue, nausea, vomiting, or if the area of redness spreads down the back or if you develop foul-smelling drainage from the site.  I would like for you to follow-up with your primary care physician this week for reevaluation. Follow-up as needed.

## 2024-01-28 DIAGNOSIS — L723 Sebaceous cyst: Secondary | ICD-10-CM | POA: Diagnosis not present

## 2024-01-28 DIAGNOSIS — B432 Subcutaneous pheomycotic abscess and cyst: Secondary | ICD-10-CM | POA: Diagnosis not present

## 2024-02-03 DIAGNOSIS — Z0189 Encounter for other specified special examinations: Secondary | ICD-10-CM | POA: Diagnosis not present

## 2024-02-03 DIAGNOSIS — K589 Irritable bowel syndrome without diarrhea: Secondary | ICD-10-CM | POA: Diagnosis not present

## 2024-02-03 DIAGNOSIS — E785 Hyperlipidemia, unspecified: Secondary | ICD-10-CM | POA: Diagnosis not present

## 2024-02-03 DIAGNOSIS — I10 Essential (primary) hypertension: Secondary | ICD-10-CM | POA: Diagnosis not present

## 2024-02-03 DIAGNOSIS — L02212 Cutaneous abscess of back [any part, except buttock]: Secondary | ICD-10-CM | POA: Diagnosis not present

## 2024-02-03 DIAGNOSIS — Z0000 Encounter for general adult medical examination without abnormal findings: Secondary | ICD-10-CM | POA: Diagnosis not present

## 2024-02-10 DIAGNOSIS — L02212 Cutaneous abscess of back [any part, except buttock]: Secondary | ICD-10-CM | POA: Diagnosis not present

## 2024-02-19 ENCOUNTER — Encounter: Payer: Self-pay | Admitting: General Surgery

## 2024-02-19 ENCOUNTER — Ambulatory Visit: Admitting: General Surgery

## 2024-02-19 VITALS — BP 114/72 | HR 89 | Temp 98.3°F | Resp 14 | Ht 63.0 in | Wt 151.0 lb

## 2024-02-19 DIAGNOSIS — L723 Sebaceous cyst: Secondary | ICD-10-CM

## 2024-02-19 NOTE — Patient Instructions (Signed)
 Take 1000 mg of tylenol before the procedure.  If this starts to act infected again, call the office and we can restart some antibiotic.

## 2024-02-19 NOTE — Progress Notes (Signed)
 [facility] History and Physical  Reason for Referral: Sebaceous cyst on back   Chief Complaint   New Patient (Initial Visit)     Karen Fitzgerald is a 68 y.o. female.  HPI:   Discussed the use of AI scribe software for clinical note transcription with the patient, who gave verbal consent to proceed.  History of Present Illness Karen Fitzgerald is a 68 year old female who presents with a recurrent sebaceous cyst for surgical consultation.  She has a recurrent sebaceous cyst on her upper back, initially noticed a  few years ago. The cyst ruptured initially, and she opted for monitoring without further treatment. It recurred, increased in size, and ruptured again approximately three weeks ago. She is now seeking surgical removal. No fever or chills are associated with the cyst.  She completed a two-week course of doxycycline, which resulted in gastrointestinal upset, specifically constipation. These symptoms resolved after discontinuing the medication.  Her past medical history includes elevated cholesterol and blood pressure, but she does not take regular medications for these conditions and denies the use of blood thinners. No fever, chills, or leg swelling.     Past Medical History:  Diagnosis Date   High cholesterol    Hypertension     Past Surgical History:  Procedure Laterality Date   COLONOSCOPY     COLONOSCOPY N/A 11/22/2016   Procedure: COLONOSCOPY;  Surgeon: West Bali, MD;  [facility];  Service: Endoscopy;  Laterality: N/A;  11:30 Am   COLONOSCOPY WITH PROPOFOL N/A 02/13/2022   Procedure: COLONOSCOPY WITH PROPOFOL;  Surgeon: Lanelle Bal, DO;  [facility];  Service: Endoscopy;  Laterality: N/A;  1:30 / ASA 2   NECK SURGERY     POLYPECTOMY  02/13/2022   Procedure: POLYPECTOMY;  Surgeon: Lanelle Bal, DO;  [facility];  Service: Endoscopy;;    Family History  Problem Relation Age of Onset   Colon  cancer Neg Hx     Social History   Tobacco Use   Smoking status: Never   Smokeless tobacco: Never  Substance Use Topics   Alcohol use: No   Drug use: No    Medications: I have reviewed the patient's current medications. Allergies as of 02/19/2024   No Known Allergies      Medication List        Accurate as of February 19, 2024  9:22 AM. If you have any questions, ask your nurse or doctor.          STOP taking these medications    lovastatin 20 MG tablet Commonly known as: MEVACOR Stopped by: Lucretia Roers       TAKE these medications    amLODipine 5 MG tablet Commonly known as: NORVASC Take 5 mg by mouth daily.   benazepril 10 MG tablet Commonly known as: LOTENSIN Take 10 mg by mouth daily.   chlorhexidine 4 % external liquid Commonly known as: Hibiclens Apply topically daily as needed. Mix a dime size amount of the solution with water and cleanse the affected area twice daily until symptoms improve.   doxycycline 100 MG tablet Commonly known as: VIBRA-TABS Take 100 mg by mouth 2 (two) times daily.   ELDERBERRY PO Take by mouth.   Multivitamin Gummies Adult Chew Chew 2 each by mouth daily at 6 (six) AM.   ZZZQUIL PO Take by mouth.         ROS:  A comprehensive review of systems  was negative except for: Cardiovascular: positive for HTN Musculoskeletal: positive for back pain  Blood pressure 114/72, pulse 89, temperature 98.3 F (36.8 C), temperature source Oral, resp. rate 14, height 5\' 3"  (1.6 m), weight 151 lb (68.5 kg), SpO2 96%.  Physical Exam GENERAL: Alert, cooperative, well developed, no acute distress HEENT: Normocephalic, normal oropharynx, moist mucous membranes CHEST: Clear to auscultation bilaterally, no wheezes, rhonchi, or crackles CARDIOVASCULAR: Normal heart rate and rhythm, S1 and S2 normal without murmurs ABDOMEN: Soft, non-tender, non-distended, without organomegaly, normal bowel sounds EXTREMITIES: No cyanosis or  edema NEUROLOGICAL: Cranial nerves grossly intact, moves all extremities without gross motor or sensory deficit SKIN: Sebaceous cyst on upper back, 3-36mm wound with dried crusty white keratin, bandaged  Results: None   Assessment & Plan Sebaceous cyst Recurrent cyst on upper back, previously ruptured, currently non-inflamed. Patient opted for surgical removal to prevent recurrence. - Schedule surgical excision for May 8th under local anesthesia. - Advise acetaminophen for pain management before procedure. - Instruct to keep cyst covered to prevent drainage until procedure.    All questions were answered to the satisfaction of the patient.   Lucretia Roers 02/19/2024, 9:22 AM

## 2024-02-20 ENCOUNTER — Ambulatory Visit: Admitting: General Surgery

## 2024-02-27 DIAGNOSIS — H5213 Myopia, bilateral: Secondary | ICD-10-CM | POA: Diagnosis not present

## 2024-04-02 ENCOUNTER — Ambulatory Visit (INDEPENDENT_AMBULATORY_CARE_PROVIDER_SITE_OTHER): Admitting: General Surgery

## 2024-04-02 ENCOUNTER — Encounter: Payer: Self-pay | Admitting: General Surgery

## 2024-04-02 VITALS — BP 111/74 | HR 58 | Temp 98.2°F | Resp 14 | Ht 63.0 in | Wt 150.0 lb

## 2024-04-02 DIAGNOSIS — L723 Sebaceous cyst: Secondary | ICD-10-CM | POA: Diagnosis not present

## 2024-04-02 DIAGNOSIS — L720 Epidermal cyst: Secondary | ICD-10-CM | POA: Diagnosis not present

## 2024-04-02 NOTE — Patient Instructions (Signed)
 Leave dressing in place 5/10.  After that you can remove the bandage and shower. Until then you can birdbath. After your remove the bandage on 5/10 you can do neosporin and bandaid on the area for the next 5 days and then keep it covered if needed if bra is rubbing area.  Call with concerns.   Tylenol and ibuprofen  for pain control.

## 2024-04-02 NOTE — Progress Notes (Signed)
 [facility] Procedure Note  04/02/24  Pre-procedure Diagnosis:  Sebaceous cyst back    Post-procedure Diagnosis: Same   Procedure(s) Performed: Excision of cyst 1.5cm from the mid back    Surgeon: Dixon Fredrickson. Collene Dawson, MD   Assistants: No qualified resident was available    Anesthesia: Lidocaine  1%    Specimens:  Cyst    Estimated Blood Loss: Minimal  Wound Class: Clean contaminated    Procedure Indications: Ms. Karen Fitzgerald is a 68 yo who had an infected cyst on her mid back and this was treated with antibiotics. She has come after the acute inflammatory process resolved to get an excision. The area is much better now with just the central pit /black head type appearance noted where the inflamed swollen cyst was located.   Findings: Hardened dermis with fibrotic changes    Procedure: The patient was taken to the procedure room and placed prone. The mid back was prepared and draped in the usual sterile fashion. Lidocaine  1% was injected. An elliptical incision was made around the central pit and carried down with sharp dissection with a scalpel and scissors. The area was removed in its entirety without violation of the cyst wall. I was down to healthy fat at the base. The cavity as made hemostatic. An interrupted 3-0 Vicryl closed the space. Nylon 3-0 Vertical mattress closed the skin. Bacitracin and bandaid were placed.    Final inspection revealed acceptable hemostasis. The patient tolerated the procedure well.   Instructions:  Leave dressing in place 5/10.  After that you can remove the bandage and shower. Until then you can birdbath. After your remove the bandage on 5/10 you can do neosporin and bandaid on the area for the next 5 days and then keep it covered if needed if bra is rubbing area.  Call with concerns.   Tylenol and ibuprofen  for pain control.  Future Appointments  Date Time Provider Department Center  04/15/2024 11:30 AM Awilda Bogus, MD  [facility] None     Deena Farrier, MD [facility] [address] [place] [place], [place] [address] [phone] (office)

## 2024-04-03 NOTE — Addendum Note (Signed)
 Addended by: Valere Gata B on: 04/03/2024 09:19 AM   Modules accepted: Orders

## 2024-04-07 LAB — PATHOLOGY REPORT

## 2024-04-07 LAB — TISSUE SPECIMEN

## 2024-04-08 ENCOUNTER — Ambulatory Visit: Payer: Self-pay | Admitting: General Surgery

## 2024-04-08 NOTE — Progress Notes (Signed)
 Will notify patient cyst was benign at her suture removal appt.

## 2024-04-15 ENCOUNTER — Ambulatory Visit (INDEPENDENT_AMBULATORY_CARE_PROVIDER_SITE_OTHER): Admitting: General Surgery

## 2024-04-15 ENCOUNTER — Encounter: Payer: Self-pay | Admitting: General Surgery

## 2024-04-15 VITALS — BP 116/77 | HR 80 | Temp 98.1°F | Resp 14 | Ht 63.0 in | Wt 149.0 lb

## 2024-04-15 DIAGNOSIS — L723 Sebaceous cyst: Secondary | ICD-10-CM

## 2024-04-15 NOTE — Progress Notes (Signed)
 [facility]  No issues. Pathology consistent with cyst.   BP 116/77   Pulse 80   Temp 98.1 F (36.7 C) (Oral)   Resp 14   Ht 5\' 3"  (1.6 m)   Wt 149 lb (67.6 kg)   SpO2 95%   BMI 26.39 kg/m  Sutures intact, no signs of erythema or drainage Sutures removed, steri strips placed  Patient s/p cyst excision on the back. Doing well.   Steristrips will peel off in the next 5-7 days. You can remove them once they are peeling off. It is ok to shower. Pat the area dry.  Activity as tolerated  PRN Follow up   Deena Farrier, MD [facility] [address] [place] [place] [place], [place] [address] [phone] (office)

## 2024-04-15 NOTE — Patient Instructions (Signed)
Steristrips will peel off in the next 5-7 days. You can remove them once they are peeling off. It is ok to shower. Pat the area dry.   Activity as tolerated.

## 2024-05-12 DIAGNOSIS — E7800 Pure hypercholesterolemia, unspecified: Secondary | ICD-10-CM | POA: Diagnosis not present

## 2024-05-12 DIAGNOSIS — I10 Essential (primary) hypertension: Secondary | ICD-10-CM | POA: Diagnosis not present

## 2024-05-12 DIAGNOSIS — K589 Irritable bowel syndrome without diarrhea: Secondary | ICD-10-CM | POA: Diagnosis not present

## 2024-07-06 ENCOUNTER — Other Ambulatory Visit (HOSPITAL_COMMUNITY): Payer: Self-pay | Admitting: Family Medicine

## 2024-07-06 DIAGNOSIS — Z1231 Encounter for screening mammogram for malignant neoplasm of breast: Secondary | ICD-10-CM

## 2024-08-14 ENCOUNTER — Encounter (HOSPITAL_COMMUNITY): Payer: Self-pay

## 2024-08-14 ENCOUNTER — Ambulatory Visit (HOSPITAL_COMMUNITY)
Admission: RE | Admit: 2024-08-14 | Discharge: 2024-08-14 | Disposition: A | Discharge status: Home / Self Care | Source: Ambulatory Visit | Attending: Family Medicine | Admitting: Family Medicine

## 2024-08-14 DIAGNOSIS — Z1231 Encounter for screening mammogram for malignant neoplasm of breast: Secondary | ICD-10-CM | POA: Diagnosis not present

## 2024-09-08 DIAGNOSIS — E7800 Pure hypercholesterolemia, unspecified: Secondary | ICD-10-CM | POA: Diagnosis not present

## 2024-09-08 DIAGNOSIS — K589 Irritable bowel syndrome without diarrhea: Secondary | ICD-10-CM | POA: Diagnosis not present

## 2024-09-08 DIAGNOSIS — I10 Essential (primary) hypertension: Secondary | ICD-10-CM | POA: Diagnosis not present
